# Patient Record
Sex: Male | Born: 1955 | Hispanic: No | Marital: Married | Smoking: Former smoker
Health system: Southern US, Community
[De-identification: ages and names within clinical notes are randomized; demographics above are authoritative.]

## PROBLEM LIST (undated history)

## (undated) DIAGNOSIS — I10 Essential (primary) hypertension: Secondary | ICD-10-CM

## (undated) DIAGNOSIS — M25471 Effusion, right ankle: Secondary | ICD-10-CM

## (undated) DIAGNOSIS — M25472 Effusion, left ankle: Secondary | ICD-10-CM

## (undated) DIAGNOSIS — A0472 Enterocolitis due to Clostridium difficile, not specified as recurrent: Secondary | ICD-10-CM

## (undated) DIAGNOSIS — N200 Calculus of kidney: Secondary | ICD-10-CM

## (undated) DIAGNOSIS — F32A Depression, unspecified: Secondary | ICD-10-CM

## (undated) DIAGNOSIS — A0471 Enterocolitis due to Clostridium difficile, recurrent: Principal | ICD-10-CM

## (undated) DIAGNOSIS — R269 Unspecified abnormalities of gait and mobility: Secondary | ICD-10-CM

## (undated) DIAGNOSIS — F329 Major depressive disorder, single episode, unspecified: Secondary | ICD-10-CM

## (undated) HISTORY — PX: LITHOTRIPSY: SUR834

## (undated) HISTORY — PX: NO PAST SURGERIES: SHX2092

## (undated) HISTORY — DX: Effusion, right ankle: M25.471

## (undated) HISTORY — DX: Effusion, left ankle: M25.472

## (undated) HISTORY — DX: Enterocolitis due to Clostridium difficile, recurrent: A04.71

---

## 2014-03-06 ENCOUNTER — Emergency Department (HOSPITAL_COMMUNITY): Payer: BC Managed Care – PPO

## 2014-03-06 ENCOUNTER — Encounter (HOSPITAL_COMMUNITY): Payer: Self-pay | Admitting: Emergency Medicine

## 2014-03-06 ENCOUNTER — Emergency Department (HOSPITAL_COMMUNITY)
Admission: EM | Admit: 2014-03-06 | Discharge: 2014-03-06 | Disposition: A | Discharge status: Home / Self Care | Payer: BC Managed Care – PPO | Attending: Emergency Medicine | Admitting: Emergency Medicine

## 2014-03-06 DIAGNOSIS — R319 Hematuria, unspecified: Secondary | ICD-10-CM

## 2014-03-06 DIAGNOSIS — N390 Urinary tract infection, site not specified: Secondary | ICD-10-CM | POA: Insufficient documentation

## 2014-03-06 DIAGNOSIS — I10 Essential (primary) hypertension: Secondary | ICD-10-CM | POA: Insufficient documentation

## 2014-03-06 DIAGNOSIS — Z87891 Personal history of nicotine dependence: Secondary | ICD-10-CM | POA: Insufficient documentation

## 2014-03-06 DIAGNOSIS — Z79899 Other long term (current) drug therapy: Secondary | ICD-10-CM | POA: Insufficient documentation

## 2014-03-06 DIAGNOSIS — Z87442 Personal history of urinary calculi: Secondary | ICD-10-CM | POA: Insufficient documentation

## 2014-03-06 HISTORY — DX: Essential (primary) hypertension: I10

## 2014-03-06 HISTORY — DX: Calculus of kidney: N20.0

## 2014-03-06 LAB — URINALYSIS, ROUTINE W REFLEX MICROSCOPIC
Bilirubin Urine: NEGATIVE
Glucose, UA: NEGATIVE mg/dL
Ketones, ur: NEGATIVE mg/dL
NITRITE: NEGATIVE
PH: 7 (ref 5.0–8.0)
Protein, ur: NEGATIVE mg/dL
Specific Gravity, Urine: 1.018 (ref 1.005–1.030)
UROBILINOGEN UA: 1 mg/dL (ref 0.0–1.0)

## 2014-03-06 LAB — URINE MICROSCOPIC-ADD ON

## 2014-03-06 MED ORDER — LIDOCAINE HCL (PF) 1 % IJ SOLN
5.0000 mL | Freq: Once | INTRAMUSCULAR | Status: AC
  Administered 2014-03-06: 2.1 mL

## 2014-03-06 MED ORDER — CEPHALEXIN 500 MG PO CAPS: 500.0000 mg | ORAL_CAPSULE | Freq: Four times a day (QID) | ORAL | Status: DC

## 2014-03-06 MED ORDER — CEFTRIAXONE SODIUM 1 G IJ SOLR
1.0000 g | Freq: Once | INTRAMUSCULAR | Status: AC
  Administered 2014-03-06: 1 g via INTRAMUSCULAR
  Filled 2014-03-06: qty 10

## 2014-03-06 MED ORDER — LIDOCAINE HCL (PF) 1 % IJ SOLN
INTRAMUSCULAR | Status: AC
  Filled 2014-03-06: qty 5

## 2014-03-06 NOTE — Discharge Instructions (Signed)
Drink plenty of fluids. Take keflex (antibiotic) as prescribed. Follow up with urologist in coming week - see referral - call Monday to arrange follow up appointment. Return to ER if worse, unable to void, severe abdominal/flank pain, persistent vomiting, weak/faint, other concern.  A urine culture was sent the results of which will be back in 2-3 days - have your doctor follow up on that result then.  Your ct scan showed several stones in the left kidney, but none causing a blockage or obstruction to the ureter.   Your blood pressure is high tonight - follow up with your doctor in the next couple weeks.      Hematuria, Adult Hematuria is blood in your urine. It can be caused by a bladder infection, kidney infection, prostate infection, kidney stone, or cancer of your urinary tract. Infections can usually be treated with medicine, and a kidney stone usually will pass through your urine. If neither of these is the cause of your hematuria, further workup to find out the reason may be needed. It is very important that you tell your health care provider about any blood you see in your urine, even if the blood stops without treatment or happens without causing pain. Blood in your urine that happens and then stops and then happens again can be a symptom of a very serious condition. Also, pain is not a symptom in the initial stages of many urinary cancers. HOME CARE INSTRUCTIONS   Drink lots of fluid, 3 4 quarts a day. If you have been diagnosed with an infection, cranberry juice is especially recommended, in addition to large amounts of water.  Avoid caffeine, tea, and carbonated beverages, because they tend to irritate the bladder.  Avoid alcohol because it may irritate the prostate.  Only take over-the-counter or prescription medicines for pain, discomfort, or fever as directed by your health care provider.  If you have been diagnosed with a kidney stone, follow your health care provider's  instructions regarding straining your urine to catch the stone.  Empty your bladder often. Avoid holding urine for long periods of time.  After a bowel movement, women should cleanse front to back. Use each tissue only once.  Empty your bladder before and after sexual intercourse if you are a male. SEEK MEDICAL CARE IF: You develop back pain, fever, a feeling of sickness in your stomach (nausea), or vomiting or if your symptoms are not better in 3 days. Return sooner if you are getting worse. SEEK IMMEDIATE MEDICAL CARE IF:   You have a persistent fever, with a temperature of 101.51F (38.8C) or greater.  You develop severe vomiting and are unable to keep the medicine down.  You develop severe back or abdominal pain despite taking your medicines.  You begin passing a large amount of blood or clots in your urine.  You feel extremely weak or faint, or you pass out. MAKE SURE YOU:   Understand these instructions.  Will watch your condition.  Will get help right away if you are not doing well or get worse. Document Released: 12/03/2005 Document Revised: 09/23/2013 Document Reviewed: 08/03/2013 [facility] Patient Information 2014 [employer], [place].

## 2014-03-06 NOTE — ED Provider Notes (Signed)
CSN: [id number]     Arrival date & time 03/06/14  2038 History   First MD Initiated Contact with Patient 03/06/14 2131     Chief Complaint  Patient presents with  . Hematuria     (Consider location/radiation/quality/duration/timing/severity/associated sxs/prior Treatment) Patient is a 58 y.o. male presenting with hematuria. The history is provided by the patient.  Hematuria Pertinent negatives include no chest pain, no abdominal pain, no headaches and no shortness of breath.  pt w remote hx kidney stones, c/o blood in urine for the past several days, esp in AM.  Denies dysuria, urgency or frequency. Pt states feels as if is able to empty bladder completely. C/o dull left lateral/posterior flank pain. Constant, dull, mild, non radiating, no specific exacerbating or alleviating factors. No scrotal or testicular pain or swelling. No trauma to abdomen or flank. No fall. No other abn bleeding or bruising. No anticoag use. No nv. Normal appetite. No faintness or dizziness.     Past Medical History  Diagnosis Date  . Kidney stones   . Hypertension    Past Surgical History  Procedure Laterality Date  . Lithotripsy     No family history on file. History  Substance Use Topics  . Smoking status: Former Smoker    Quit date: 12/17/1984  . Smokeless tobacco: Not on file  . Alcohol Use: Yes     Comment: Socially    Review of Systems  Constitutional: Negative for fever and chills.  HENT: Negative for sore throat.   Eyes: Negative for redness.  Respiratory: Negative for shortness of breath.   Cardiovascular: Negative for chest pain.  Gastrointestinal: Negative for nausea, vomiting and abdominal pain.  Genitourinary: Positive for hematuria and flank pain.  Musculoskeletal: Negative for neck pain.  Skin: Negative for rash.  Neurological: Negative for headaches.  Hematological: Does not bruise/bleed easily.  Psychiatric/Behavioral: Negative for confusion.      Allergies  Review of  patient's allergies indicates no known allergies.  Home Medications   Current Outpatient Rx  Name  Route  Sig  Dispense  Refill  . ALPRAZolam (XANAX) 0.25 MG tablet   Oral   Take 0.25 mg by mouth at bedtime as needed for sleep.         . busPIRone (BUSPAR) 15 MG tablet   Oral   Take 15 mg by mouth daily.         . cholecalciferol (VITAMIN D) 1000 UNITS tablet   Oral   Take 1,000 Units by mouth daily.         Marland Kitchen escitalopram (LEXAPRO) 20 MG tablet   Oral   Take 20 mg by mouth daily.         . folic acid (FOLVITE) 400 MCG tablet   Oral   Take 400 mcg by mouth daily.         Marland Kitchen losartan (COZAAR) 100 MG tablet   Oral   Take 100 mg by mouth daily.         Marland Kitchen MAGNESIUM PO   Oral   Take 400 mg by mouth daily.         Marland Kitchen OVER THE COUNTER MEDICATION   Oral   Take 1 tablet by mouth daily. Zinc         . Tetrahydrozoline HCl (VISINE OP)   Both Eyes   Place 1 drop into both eyes daily.          BP 178/87  Pulse 73  Resp 18  Ht 5\' 7"  (  1.702 m)  Wt 226 lb 8 oz (102.74 kg)  BMI 35.47 kg/m2  SpO2 98% Physical Exam  Nursing note and vitals reviewed. Constitutional: He is oriented to person, place, and time. He appears well-developed and well-nourished. No distress.  HENT:  Head: Atraumatic.  Eyes: Conjunctivae are normal.  Neck: Neck supple. No tracheal deviation present.  Cardiovascular: Normal rate.   Pulmonary/Chest: Effort normal and breath sounds normal. No accessory muscle usage. No respiratory distress.  Abdominal: Soft. Bowel sounds are normal. He exhibits no distension and no mass. There is no tenderness. There is no rebound and no guarding.  Genitourinary:  Normal ext genitalia. No scrotal or testicular pain, swelling, or tenderness.  No cva tenderness.   Musculoskeletal: Normal range of motion.  Neurological: He is alert and oriented to person, place, and time.  Skin: Skin is warm and dry. No rash noted.  No petechia.   Psychiatric: He has a  normal mood and affect.    ED Course  Procedures (including critical care time)   Results for orders placed during the hospital encounter of 03/06/14  URINALYSIS, ROUTINE W REFLEX MICROSCOPIC      Result Value Ref Range   Color, Urine YELLOW  YELLOW   APPearance CLOUDY (*) CLEAR   Specific Gravity, Urine 1.018  1.005 - 1.030   pH 7.0  5.0 - 8.0   Glucose, UA NEGATIVE  NEGATIVE mg/dL   Hgb urine dipstick LARGE (*) NEGATIVE   Bilirubin Urine NEGATIVE  NEGATIVE   Ketones, ur NEGATIVE  NEGATIVE mg/dL   Protein, ur NEGATIVE  NEGATIVE mg/dL   Urobilinogen, UA 1.0  0.0 - 1.0 mg/dL   Nitrite NEGATIVE  NEGATIVE   Leukocytes, UA SMALL (*) NEGATIVE  URINE MICROSCOPIC-ADD ON      Result Value Ref Range   Squamous Epithelial / LPF RARE  RARE   WBC, UA 0-2  <3 WBC/hpf   RBC / HPF TOO NUMEROUS TO COUNT  <3 RBC/hpf   Bacteria, UA FEW (*) RARE   Ct Abdomen Pelvis Wo Contrast  03/06/2014   CLINICAL DATA:  History renal stones, back pain  EXAM: CT ABDOMEN AND PELVIS WITHOUT CONTRAST  TECHNIQUE: Multidetector CT imaging of the abdomen and pelvis was performed following the standard protocol without intravenous contrast.  COMPARISON:  None.  FINDINGS: Lung bases are clear.  No pericardial fluid.  Non IV contrast images demonstrate no focal hepatic lesion. The gallbladder, pancreas, spleen, adrenal glands normal.  There is a large calculus in the left renal pelvis measuring 10 mm (image 55, series 2). There is no significant hydronephrosis on the left. There is a linear 17 mm calcification the cortex of the left kidney. There several additional punctate 1 mm calculi within the left renal collecting systems. There is no hydroureter hydroureter on the left. No ureteral stones on the left. Right kidney is free of calculi. No right ureteral lithiasis. No bladder stones are present.  Stomach, small bowel, appendix, cecum normal. The colon and rectosigmoid colon are normal. Partial diverticulum sigmoid colon.   Abdominal or is normal caliber. No retroperitoneal periportal lymphadenopathy.  No free fluid the pelvis. The prostate gland and bladder normal. No pelvic lymphadenopathy. No aggressive osseous lesion.  IMPRESSION: 1. Large nonobstructing calculus in the left renal pelvis. 2. Additional multiple left nephrolithiasis. 3. While there is mild stranding along the proximal left ureter, there is no hydroureter or ureteral calculi present. 4. No bladder calculi. 5. No right nephrolithiasis ureterolithiasis. 6. Sigmoid diverticulosis without diverticulitis.  Electronically Signed   By: Genevive BiStewart  Edmunds M.D.   On: 03/06/2014 22:56     MDM  Labs. Ct.  Reviewed nursing notes and prior charts for additional history.   ua cloudy, LE +, few bact.  Will culture.  Rocephin im. rx keflex for home.  abd soft nt.  Pt appears stable for d/c.  F/u urology.      Suzi RootsKevin E Domanic Matusek, MD 03/06/14 2308

## 2014-03-06 NOTE — ED Notes (Addendum)
Pt states he has been having blood in his urine in the morning for the past week/  Pt states history of kidney stones 8 years prior.  Pt states he feel "pressure" in his left kidney.

## 2014-03-06 NOTE — ED Notes (Signed)
VS stable. Pt. Ambulatory and being discharged with his wife.

## 2014-03-06 NOTE — ED Notes (Signed)
Pt states he has some pressure in his right kidney. Has has hx of same.

## 2014-03-08 LAB — URINE CULTURE
Colony Count: NO GROWTH
Culture: NO GROWTH

## 2014-03-14 ENCOUNTER — Encounter (HOSPITAL_COMMUNITY): Payer: Self-pay | Admitting: Emergency Medicine

## 2014-03-14 ENCOUNTER — Emergency Department (HOSPITAL_COMMUNITY): Payer: Worker's Compensation

## 2014-03-14 ENCOUNTER — Inpatient Hospital Stay (HOSPITAL_COMMUNITY)
Admission: EM | Admit: 2014-03-14 | Discharge: 2014-03-18 | DRG: 373 | Disposition: A | Discharge status: Home / Self Care | Payer: Worker's Compensation | Attending: Internal Medicine | Admitting: Internal Medicine

## 2014-03-14 DIAGNOSIS — E876 Hypokalemia: Secondary | ICD-10-CM | POA: Diagnosis present

## 2014-03-14 DIAGNOSIS — A0472 Enterocolitis due to Clostridium difficile, not specified as recurrent: Principal | ICD-10-CM | POA: Diagnosis present

## 2014-03-14 DIAGNOSIS — M109 Gout, unspecified: Secondary | ICD-10-CM | POA: Diagnosis present

## 2014-03-14 DIAGNOSIS — Z87442 Personal history of urinary calculi: Secondary | ICD-10-CM

## 2014-03-14 DIAGNOSIS — I10 Essential (primary) hypertension: Secondary | ICD-10-CM | POA: Diagnosis present

## 2014-03-14 DIAGNOSIS — A0471 Enterocolitis due to Clostridium difficile, recurrent: Secondary | ICD-10-CM | POA: Diagnosis present

## 2014-03-14 DIAGNOSIS — K529 Noninfective gastroenteritis and colitis, unspecified: Secondary | ICD-10-CM

## 2014-03-14 DIAGNOSIS — E860 Dehydration: Secondary | ICD-10-CM | POA: Diagnosis present

## 2014-03-14 DIAGNOSIS — R197 Diarrhea, unspecified: Secondary | ICD-10-CM | POA: Diagnosis not present

## 2014-03-14 DIAGNOSIS — Z87891 Personal history of nicotine dependence: Secondary | ICD-10-CM

## 2014-03-14 DIAGNOSIS — N200 Calculus of kidney: Secondary | ICD-10-CM | POA: Diagnosis present

## 2014-03-14 LAB — URINALYSIS, ROUTINE W REFLEX MICROSCOPIC
Bilirubin Urine: NEGATIVE
Glucose, UA: NEGATIVE mg/dL
KETONES UR: NEGATIVE mg/dL
Nitrite: NEGATIVE
PROTEIN: 30 mg/dL — AB
Specific Gravity, Urine: 1.016 (ref 1.005–1.030)
Urobilinogen, UA: 0.2 mg/dL (ref 0.0–1.0)
pH: 6.5 (ref 5.0–8.0)

## 2014-03-14 LAB — CBC WITH DIFFERENTIAL/PLATELET
BASOS PCT: 0 % (ref 0–1)
Basophils Absolute: 0 10*3/uL (ref 0.0–0.1)
EOS ABS: 0 10*3/uL (ref 0.0–0.7)
Eosinophils Relative: 0 % (ref 0–5)
HCT: 51.2 % (ref 39.0–52.0)
Hemoglobin: 18 g/dL — ABNORMAL HIGH (ref 13.0–17.0)
Lymphocytes Relative: 11 % — ABNORMAL LOW (ref 12–46)
Lymphs Abs: 1.5 10*3/uL (ref 0.7–4.0)
MCH: 30.3 pg (ref 26.0–34.0)
MCHC: 35.2 g/dL (ref 30.0–36.0)
MCV: 86.1 fL (ref 78.0–100.0)
MONO ABS: 1.8 10*3/uL — AB (ref 0.1–1.0)
Monocytes Relative: 14 % — ABNORMAL HIGH (ref 3–12)
NEUTROS ABS: 9.5 10*3/uL — AB (ref 1.7–7.7)
NEUTROS PCT: 74 % (ref 43–77)
Platelets: 276 10*3/uL (ref 150–400)
RBC: 5.95 MIL/uL — ABNORMAL HIGH (ref 4.22–5.81)
RDW: 14.6 % (ref 11.5–15.5)
WBC: 12.9 10*3/uL — ABNORMAL HIGH (ref 4.0–10.5)

## 2014-03-14 LAB — COMPREHENSIVE METABOLIC PANEL
ALBUMIN: 3.7 g/dL (ref 3.5–5.2)
ALT: 22 U/L (ref 0–53)
AST: 14 U/L (ref 0–37)
Alkaline Phosphatase: 89 U/L (ref 39–117)
BILIRUBIN TOTAL: 0.6 mg/dL (ref 0.3–1.2)
BUN: 7 mg/dL (ref 6–23)
CO2: 23 mEq/L (ref 19–32)
CREATININE: 0.97 mg/dL (ref 0.50–1.35)
Calcium: 9.4 mg/dL (ref 8.4–10.5)
Chloride: 99 mEq/L (ref 96–112)
GFR calc Af Amer: >90 mL/min (ref >90)
GFR calc non Af Amer: 89 mL/min — ABNORMAL LOW (ref >90)
Glucose, Bld: 123 mg/dL — ABNORMAL HIGH (ref 70–99)
Potassium: 3.2 mEq/L — ABNORMAL LOW (ref 3.7–5.3)
Sodium: 139 mEq/L (ref 137–147)
TOTAL PROTEIN: 7.9 g/dL (ref 6.0–8.3)

## 2014-03-14 LAB — URINE MICROSCOPIC-ADD ON

## 2014-03-14 LAB — POC OCCULT BLOOD, ED: FECAL OCCULT BLD: POSITIVE — AB

## 2014-03-14 MED ORDER — DICYCLOMINE HCL 10 MG/ML IM SOLN
20.0000 mg | Freq: Once | INTRAMUSCULAR | Status: AC
  Administered 2014-03-14: 20 mg via INTRAMUSCULAR
  Filled 2014-03-14: qty 2

## 2014-03-14 MED ORDER — METRONIDAZOLE 500 MG PO TABS
500.0000 mg | ORAL_TABLET | Freq: Once | ORAL | Status: AC
  Administered 2014-03-14: 500 mg via ORAL
  Filled 2014-03-14: qty 1

## 2014-03-14 MED ORDER — CIPROFLOXACIN HCL 500 MG PO TABS
500.0000 mg | ORAL_TABLET | Freq: Once | ORAL | Status: AC
  Administered 2014-03-14: 500 mg via ORAL
  Filled 2014-03-14: qty 1

## 2014-03-14 MED ORDER — POTASSIUM CHLORIDE CRYS ER 20 MEQ PO TBCR
40.0000 meq | EXTENDED_RELEASE_TABLET | Freq: Once | ORAL | Status: DC
  Filled 2014-03-14: qty 2

## 2014-03-14 MED ORDER — SODIUM CHLORIDE 0.9 % IV BOLUS (SEPSIS)
1000.0000 mL | Freq: Once | INTRAVENOUS | Status: AC
  Administered 2014-03-14: 1000 mL via INTRAVENOUS

## 2014-03-14 MED ORDER — ONDANSETRON HCL 4 MG/2ML IJ SOLN
4.0000 mg | Freq: Once | INTRAMUSCULAR | Status: DC
  Filled 2014-03-14: qty 2

## 2014-03-14 NOTE — ED Provider Notes (Signed)
CSN: [id number]     Arrival date & time 03/14/14  1920 History   First MD Initiated Contact with Patient 03/14/14 2000     Chief Complaint  Patient presents with  . Diarrhea   HPI  History provided by the patient and wife. Patient is a 58 year old male with history of hypertension and previous kidney stones who presents with complaints of persistent frequent diarrhea, abdominal cramping and weakness. Patient states he has had significant diarrhea for the past 3 days. He states diarrhea it has gone from yellow, thick green to watery. Denies seeing blood or mucus in the stool. This has caused extreme weakness. He has multiple trips to the bathroom every hour. When he has to use the bathroom he feels burning pains in his abdomen which are relieved after he uses the bathroom. He has not been taking any medications or treatment for his symptoms. He was recently seen and evaluated for possible kidney stones in the emergency room. No obstructing stone at that time but he did have several left intrarenal stones. He was treated for a possible UTI with Keflex but he states taking that made him have diarrhea and feel bad so he did not take the full course. He was feeling better after stopping that medication before his new symptoms. Patient does work as a Adult nurse and works with patients. His wife also works as an Charity fundraiser. No prior history of C. difficile. Symptoms are associated with nausea and fever. No other aggravating or alleviating factors. No other associated symptoms.    Past Medical History  Diagnosis Date  . Kidney stones   . Hypertension    Past Surgical History  Procedure Laterality Date  . Lithotripsy     No family history on file. History  Substance Use Topics  . Smoking status: Former Smoker    Quit date: 12/17/1984  . Smokeless tobacco: Not on file  . Alcohol Use: Yes     Comment: Socially    Review of Systems  Constitutional: Positive for fever, chills and fatigue.   Respiratory: Negative for shortness of breath.   Cardiovascular: Negative for chest pain.  Gastrointestinal: Positive for nausea, abdominal pain and diarrhea.  Neurological: Positive for weakness.  All other systems reviewed and are negative.      Allergies  Review of patient's allergies indicates no known allergies.  Home Medications   Current Outpatient Rx  Name  Route  Sig  Dispense  Refill  . ALPRAZolam (XANAX) 0.25 MG tablet   Oral   Take 0.25 mg by mouth at bedtime as needed for sleep.         . busPIRone (BUSPAR) 15 MG tablet   Oral   Take 15 mg by mouth daily.         . cephALEXin (KEFLEX) 500 MG capsule   Oral   Take 1 capsule (500 mg total) by mouth 4 (four) times daily.   28 capsule   0   . cholecalciferol (VITAMIN D) 1000 UNITS tablet   Oral   Take 1,000 Units by mouth daily.         Marland Kitchen escitalopram (LEXAPRO) 20 MG tablet   Oral   Take 20 mg by mouth daily.         . folic acid (FOLVITE) 400 MCG tablet   Oral   Take 400 mcg by mouth daily.         Marland Kitchen losartan (COZAAR) 100 MG tablet   Oral   Take 100 mg  by mouth daily.         Marland Kitchen MAGNESIUM PO   Oral   Take 400 mg by mouth daily.         Marland Kitchen OVER THE COUNTER MEDICATION   Oral   Take 1 tablet by mouth daily. Zinc         . Tetrahydrozoline HCl (VISINE OP)   Both Eyes   Place 1 drop into both eyes daily.          BP 151/95  Pulse 124  Temp(Src) 98.4 F (36.9 C) (Oral)  Resp 20  Ht 5\' 10"  (1.778 m)  Wt 221 lb 5 oz (100.387 kg)  BMI 31.76 kg/m2  SpO2 99% Physical Exam  Nursing note and vitals reviewed. Constitutional: He is oriented to person, place, and time. He appears well-developed and well-nourished. No distress.  HENT:  Head: Normocephalic and atraumatic.  Oromucosa dry  Cardiovascular: Regular rhythm.  Tachycardia present.   Pulmonary/Chest: Effort normal and breath sounds normal. No respiratory distress. He has no wheezes.  Abdominal: Soft. He exhibits no  distension. There is tenderness. There is guarding. There is no rebound.  Diffuse abdominal tenderness  Musculoskeletal: Normal range of motion.  Neurological: He is alert and oriented to person, place, and time.  Skin: Skin is warm.  Psychiatric: He has a normal mood and affect. His behavior is normal.    ED Course  Procedures   DIAGNOSTIC STUDIES: Oxygen Saturation is 99% on room air.    COORDINATION OF CARE:  Nursing notes reviewed. Vital signs reviewed. Initial pt interview and examination performed.   8:38 PM-patient seen and evaluated. Patient with slight tachycardia at rest around 105. He does appear weak and slightly pale. Oral mucosa is dry. Does have diffuse abdominal tenderness with slight guarding. No focal significant pain. Recent CT scan evaluating for kidney stones did show sigmoid diverticula without diverticulitis. Given his history, profession, recent antibiotic use high suspicion for possible C. difficile however diverticulitis may also be possibility. Other differential diagnoses may include viral GI process.   At this time discussed work up plan with pt at bedside, which includes laboratory testing and stool collection. Pt agrees with plan. We'll give treatments of IV fluids for dehydration and tachycardia as well as medications for nausea. Will plan to preemptively start Cipro and Flagyl.  Patient has refused by mouth potassium.  Spoke with [facility]. They will come and evaluate patient for possible admission or for further recommendations.  Treatment plan initiated: Medications  sodium chloride 0.9 % bolus 1,000 mL (not administered)  ondansetron (ZOFRAN) injection 4 mg (not administered)   Results for orders placed during the hospital encounter of 03/14/14  URINALYSIS, ROUTINE W REFLEX MICROSCOPIC      Result Value Ref Range   Color, Urine YELLOW  YELLOW   APPearance CLOUDY (*) CLEAR   Specific Gravity, Urine 1.016  1.005 - 1.030   pH 6.5  5.0 - 8.0    Glucose, UA NEGATIVE  NEGATIVE mg/dL   Hgb urine dipstick SMALL (*) NEGATIVE   Bilirubin Urine NEGATIVE  NEGATIVE   Ketones, ur NEGATIVE  NEGATIVE mg/dL   Protein, ur 30 (*) NEGATIVE mg/dL   Urobilinogen, UA 0.2  0.0 - 1.0 mg/dL   Nitrite NEGATIVE  NEGATIVE   Leukocytes, UA SMALL (*) NEGATIVE  CBC WITH DIFFERENTIAL      Result Value Ref Range   WBC 12.9 (*) 4.0 - 10.5 K/uL   RBC 5.95 (*) 4.22 - 5.81 MIL/uL  Hemoglobin 18.0 (*) 13.0 - 17.0 g/dL   HCT 16.151.2  09.639.0 - 04.552.0 %   MCV 86.1  78.0 - 100.0 fL   MCH 30.3  26.0 - 34.0 pg   MCHC 35.2  30.0 - 36.0 g/dL   RDW 40.914.6  81.111.5 - 91.415.5 %   Platelets 276  150 - 400 K/uL   Neutrophils Relative % 74  43 - 77 %   Neutro Abs 9.5 (*) 1.7 - 7.7 K/uL   Lymphocytes Relative 11 (*) 12 - 46 %   Lymphs Abs 1.5  0.7 - 4.0 K/uL   Monocytes Relative 14 (*) 3 - 12 %   Monocytes Absolute 1.8 (*) 0.1 - 1.0 K/uL   Eosinophils Relative 0  0 - 5 %   Eosinophils Absolute 0.0  0.0 - 0.7 K/uL   Basophils Relative 0  0 - 1 %   Basophils Absolute 0.0  0.0 - 0.1 K/uL  COMPREHENSIVE METABOLIC PANEL      Result Value Ref Range   Sodium 139  137 - 147 mEq/L   Potassium 3.2 (*) 3.7 - 5.3 mEq/L   Chloride 99  96 - 112 mEq/L   CO2 23  19 - 32 mEq/L   Glucose, Bld 123 (*) 70 - 99 mg/dL   BUN 7  6 - 23 mg/dL   Creatinine, Ser 7.820.97  0.50 - 1.35 mg/dL   Calcium 9.4  8.4 - 95.610.5 mg/dL   Total Protein 7.9  6.0 - 8.3 g/dL   Albumin 3.7  3.5 - 5.2 g/dL   AST 14  0 - 37 U/L   ALT 22  0 - 53 U/L   Alkaline Phosphatase 89  39 - 117 U/L   Total Bilirubin 0.6  0.3 - 1.2 mg/dL   GFR calc non Af Amer 89 (*) >90 mL/min   GFR calc Af Amer >90  >90 mL/min  URINE MICROSCOPIC-ADD ON      Result Value Ref Range   WBC, UA 3-6  <3 WBC/hpf   RBC / HPF 0-2  <3 RBC/hpf  POC OCCULT BLOOD, ED      Result Value Ref Range   Fecal Occult Bld POSITIVE (*) NEGATIVE     Imaging Review Dg Abd 1 View  03/14/2014   CLINICAL DATA:  Abdominal pain, distention and diarrhea  EXAM:  ABDOMEN - 1 VIEW  COMPARISON:  CT abdomen / pelvis 03/06/2014  FINDINGS: Nonobstructive bowel gas pattern. Stable appearance of 9 mm stone in the expected location of the left renal pelvis. No acute osseous abnormality.  IMPRESSION: 1. Nonobstructed bowel gas pattern. 2. Similar appearance of 9 mm stone in the region of the left renal pelvis.   Electronically Signed   By: Malachy MoanHeath  McCullough M.D.   On: 03/14/2014 22:40   Ct Abdomen Pelvis W Contrast  03/15/2014   CLINICAL DATA:  Diarrhea, nausea, febrile for 3 days.  EXAM: CT ABDOMEN AND PELVIS WITH CONTRAST  TECHNIQUE: Multidetector CT imaging of the abdomen and pelvis was performed using the standard protocol following bolus administration of intravenous contrast.  CONTRAST:  100mL OMNIPAQUE IOHEXOL 300 MG/ML  SOLN  COMPARISON:  DG ABD 1 VIEW dated 03/14/2014; CT ABD/PELV WO CM dated 03/06/2014  FINDINGS: Included view of the lung bases demonstrate right lower lobe atelectasis, slightly increased. . Visualized heart and pericardium are unremarkable.  Diffuse colonic wall thickening and edema from the cecum to the rectum with inflammatory pericolonic changes about the descending colon/sigmoid colon. No pneumatosis.  Scattered sigmoid diverticula. The stomach, small bowel are normal in course and caliber without inflammatory changes. Normal appendix. Trace free fluid in the pelvis without abscess or pneumoperitoneum.  Mildly diffusely hypodense liver most consistent with fatty infiltration, the liver is otherwise unremarkable. The spleen, pancreas, gallbladder and adrenal glands are unremarkable and unchanged.  10 mm of left renal pelvis nonobstructing calculus again seen, with multiple tiny left lower pole renal calculi in total measuring approximately 17 mm. No right nephrolithiasis. Two small to characterize hypodensities in left kidney. No hydronephrosis. No renal masses. Delayed imaging demonstrates prompt symmetric excretion of contrast into the proximal urinary  collecting system. Urinary bladder is decompressed and unremarkable.  Great vessels are normal in course and caliber. No lymphadenopathy by CT size criteria. Internal reproductive organs are unremarkable. Small fat containing inguinal hernias. Moderate to severe degenerative change of the right sacroiliac joint.  IMPRESSION: Diffuse colitis from the cecum to the rectum with inflammatory changes about the sigmoid ; sigmoid diverticula, it is unclear whether this reflects superimposed diverticulitis. No bowel perforation or bowel obstruction. Trace free fluid in the pelvis is likely reactive without abscess.  Multiple nonobstructing left nephrolithiasis.   Electronically Signed   By: Awilda Metro   On: 03/15/2014 01:55     EKG Interpretation None      MDM   Final diagnoses:  Diarrhea  Dehydration          Angus Seller, PA-C 03/15/14 1610

## 2014-03-14 NOTE — ED Notes (Signed)
The pt is c/o diarrhea nausea and he has had a temp for 3 days.  C/o being weak

## 2014-03-15 ENCOUNTER — Emergency Department (HOSPITAL_COMMUNITY): Payer: Worker's Compensation

## 2014-03-15 ENCOUNTER — Encounter (HOSPITAL_COMMUNITY): Payer: Self-pay | Admitting: Internal Medicine

## 2014-03-15 DIAGNOSIS — A0472 Enterocolitis due to Clostridium difficile, not specified as recurrent: Secondary | ICD-10-CM | POA: Diagnosis present

## 2014-03-15 DIAGNOSIS — Z87891 Personal history of nicotine dependence: Secondary | ICD-10-CM | POA: Diagnosis not present

## 2014-03-15 DIAGNOSIS — Z87442 Personal history of urinary calculi: Secondary | ICD-10-CM | POA: Diagnosis not present

## 2014-03-15 DIAGNOSIS — I10 Essential (primary) hypertension: Secondary | ICD-10-CM

## 2014-03-15 DIAGNOSIS — A0471 Enterocolitis due to Clostridium difficile, recurrent: Secondary | ICD-10-CM

## 2014-03-15 DIAGNOSIS — R197 Diarrhea, unspecified: Secondary | ICD-10-CM

## 2014-03-15 DIAGNOSIS — M109 Gout, unspecified: Secondary | ICD-10-CM | POA: Diagnosis present

## 2014-03-15 DIAGNOSIS — E860 Dehydration: Secondary | ICD-10-CM | POA: Diagnosis present

## 2014-03-15 DIAGNOSIS — E876 Hypokalemia: Secondary | ICD-10-CM | POA: Diagnosis present

## 2014-03-15 DIAGNOSIS — K5289 Other specified noninfective gastroenteritis and colitis: Secondary | ICD-10-CM

## 2014-03-15 HISTORY — DX: Enterocolitis due to Clostridium difficile, recurrent: A04.71

## 2014-03-15 LAB — PHOSPHORUS: PHOSPHORUS: 2.2 mg/dL — AB (ref 2.3–4.6)

## 2014-03-15 LAB — COMPREHENSIVE METABOLIC PANEL
ALBUMIN: 2.9 g/dL — AB (ref 3.5–5.2)
ALT: 16 U/L (ref 0–53)
AST: 13 U/L (ref 0–37)
Alkaline Phosphatase: 64 U/L (ref 39–117)
BUN: 7 mg/dL (ref 6–23)
CALCIUM: 8 mg/dL — AB (ref 8.4–10.5)
CO2: 20 mEq/L (ref 19–32)
Chloride: 103 mEq/L (ref 96–112)
Creatinine, Ser: 0.76 mg/dL (ref 0.50–1.35)
GFR calc Af Amer: >90 mL/min (ref >90)
GFR calc non Af Amer: >90 mL/min (ref >90)
Glucose, Bld: 119 mg/dL — ABNORMAL HIGH (ref 70–99)
Potassium: 3 mEq/L — ABNORMAL LOW (ref 3.7–5.3)
Sodium: 141 mEq/L (ref 137–147)
Total Bilirubin: 0.6 mg/dL (ref 0.3–1.2)
Total Protein: 5.8 g/dL — ABNORMAL LOW (ref 6.0–8.3)

## 2014-03-15 LAB — CBC
HCT: 42.4 % (ref 39.0–52.0)
HEMOGLOBIN: 14.6 g/dL (ref 13.0–17.0)
MCH: 29.4 pg (ref 26.0–34.0)
MCHC: 34.4 g/dL (ref 30.0–36.0)
MCV: 85.3 fL (ref 78.0–100.0)
Platelets: 197 10*3/uL (ref 150–400)
RBC: 4.97 MIL/uL (ref 4.22–5.81)
RDW: 14.6 % (ref 11.5–15.5)
WBC: 9.2 10*3/uL (ref 4.0–10.5)

## 2014-03-15 LAB — MAGNESIUM: Magnesium: 1.9 mg/dL (ref 1.5–2.5)

## 2014-03-15 LAB — TSH: TSH: 0.976 u[IU]/mL (ref 0.350–4.500)

## 2014-03-15 LAB — CLOSTRIDIUM DIFFICILE BY PCR: CDIFFPCR: POSITIVE — AB

## 2014-03-15 MED ORDER — METRONIDAZOLE 500 MG PO TABS
500.0000 mg | ORAL_TABLET | Freq: Three times a day (TID) | ORAL | Status: DC
  Administered 2014-03-15 – 2014-03-18 (×10): 500 mg via ORAL
  Filled 2014-03-15 (×12): qty 1

## 2014-03-15 MED ORDER — SODIUM CHLORIDE 0.9 % IV SOLN
INTRAVENOUS | Status: AC
  Administered 2014-03-15: 05:00:00 via INTRAVENOUS

## 2014-03-15 MED ORDER — PANTOPRAZOLE SODIUM 40 MG PO TBEC
40.0000 mg | DELAYED_RELEASE_TABLET | Freq: Every day | ORAL | Status: DC
  Administered 2014-03-15 – 2014-03-17 (×3): 40 mg via ORAL
  Filled 2014-03-15 (×3): qty 1

## 2014-03-15 MED ORDER — SACCHAROMYCES BOULARDII 250 MG PO CAPS
250.0000 mg | ORAL_CAPSULE | Freq: Two times a day (BID) | ORAL | Status: DC
  Administered 2014-03-15 – 2014-03-18 (×7): 250 mg via ORAL
  Filled 2014-03-15 (×8): qty 1

## 2014-03-15 MED ORDER — ESCITALOPRAM OXALATE 20 MG PO TABS
20.0000 mg | ORAL_TABLET | Freq: Every day | ORAL | Status: DC
  Administered 2014-03-15 – 2014-03-18 (×4): 20 mg via ORAL
  Filled 2014-03-15 (×4): qty 1

## 2014-03-15 MED ORDER — PANTOPRAZOLE SODIUM 40 MG IV SOLR: 40.0000 mg | Freq: Every day | INTRAVENOUS | Status: DC

## 2014-03-15 MED ORDER — ACETAMINOPHEN 325 MG PO TABS
650.0000 mg | ORAL_TABLET | Freq: Four times a day (QID) | ORAL | Status: DC | PRN
  Administered 2014-03-17: 650 mg via ORAL
  Filled 2014-03-15: qty 2

## 2014-03-15 MED ORDER — ALPRAZOLAM 0.25 MG PO TABS: 0.2500 mg | ORAL_TABLET | Freq: Every evening | ORAL | Status: DC | PRN

## 2014-03-15 MED ORDER — ONDANSETRON HCL 4 MG/2ML IJ SOLN: 4.0000 mg | Freq: Four times a day (QID) | INTRAMUSCULAR | Status: DC | PRN

## 2014-03-15 MED ORDER — LOSARTAN POTASSIUM 50 MG PO TABS
100.0000 mg | ORAL_TABLET | Freq: Every day | ORAL | Status: DC
  Administered 2014-03-15 – 2014-03-18 (×4): 100 mg via ORAL
  Filled 2014-03-15 (×4): qty 2

## 2014-03-15 MED ORDER — PANTOPRAZOLE SODIUM 40 MG IV SOLR
40.0000 mg | Freq: Every day | INTRAVENOUS | Status: DC
  Filled 2014-03-15: qty 40

## 2014-03-15 MED ORDER — IOHEXOL 300 MG/ML  SOLN
100.0000 mL | Freq: Once | INTRAMUSCULAR | Status: AC | PRN
  Administered 2014-03-15: 100 mL via INTRAVENOUS

## 2014-03-15 MED ORDER — ONDANSETRON HCL 4 MG PO TABS
4.0000 mg | ORAL_TABLET | Freq: Four times a day (QID) | ORAL | Status: DC | PRN
  Filled 2014-03-15: qty 1

## 2014-03-15 MED ORDER — BUSPIRONE HCL 15 MG PO TABS
15.0000 mg | ORAL_TABLET | Freq: Every day | ORAL | Status: DC
  Administered 2014-03-15 – 2014-03-18 (×4): 15 mg via ORAL
  Filled 2014-03-15 (×4): qty 1

## 2014-03-15 MED ORDER — HYDROCODONE-ACETAMINOPHEN 5-325 MG PO TABS: 1.0000 | ORAL_TABLET | ORAL | Status: DC | PRN

## 2014-03-15 MED ORDER — IOHEXOL 300 MG/ML  SOLN
20.0000 mL | INTRAMUSCULAR | Status: AC
  Administered 2014-03-15 (×2): 20 mL via ORAL

## 2014-03-15 MED ORDER — SODIUM CHLORIDE 0.9 % IJ SOLN
3.0000 mL | Freq: Two times a day (BID) | INTRAMUSCULAR | Status: DC
  Administered 2014-03-15 – 2014-03-18 (×6): 3 mL via INTRAVENOUS

## 2014-03-15 MED ORDER — ACETAMINOPHEN 650 MG RE SUPP: 650.0000 mg | Freq: Four times a day (QID) | RECTAL | Status: DC | PRN

## 2014-03-15 MED ORDER — METRONIDAZOLE IN NACL 5-0.79 MG/ML-% IV SOLN
500.0000 mg | Freq: Three times a day (TID) | INTRAVENOUS | Status: DC
  Administered 2014-03-15 (×2): 500 mg via INTRAVENOUS
  Filled 2014-03-15 (×4): qty 100

## 2014-03-15 MED ORDER — POTASSIUM CHLORIDE 10 MEQ/100ML IV SOLN
10.0000 meq | INTRAVENOUS | Status: AC
  Administered 2014-03-15 (×4): 10 meq via INTRAVENOUS
  Filled 2014-03-15 (×4): qty 100

## 2014-03-15 NOTE — ED Notes (Signed)
Patient transported to CT 

## 2014-03-15 NOTE — H&P (Signed)
PCP: not local   Chief Complaint:  diarrhea  HPI: [name] [name] is a 58 y.o. male   has a past medical history of Kidney stones and Hypertension.   Presented with  ON 21 of March patient presented to [facility] with hematuria and was though to have UTI although subsequently urine culture was negative. CT at that time showed stable left nephrolithiasis. He was started on Keflex but after taking it for 3 days he developed diarrhea and stopped it. Diarrhea has resolved up until 4 days ago when he developed fever, diarrhea, abdominal pain that feels like burning periumbilical. He had up to 7 BM today. Fever up to 102  denies any nausea no blood in stool. Patient states that any movement or vibration causes abdominal pain. Hospitalist called for admission Review of Systems:     Pertinent positives include: Fevers, chills, abdominal pain, diarrhea,   Constitutional:  No weight loss, night sweats, fatigue, weight loss  HEENT:  No headaches, Difficulty swallowing,Tooth/dental problems,Sore throat,  No sneezing, itching, ear ache, nasal congestion, post nasal drip,  Cardio-vascular:  No chest pain, Orthopnea, PND, anasarca, dizziness, palpitations.no Bilateral lower extremity swelling  GI:  No heartburn, indigestion, nausea, vomiting,change in bowel habits, loss of appetite, melena, blood in stool, hematemesis Resp:  no shortness of breath at rest. No dyspnea on exertion, No excess mucus, no productive cough, No non-productive cough, No coughing up of blood.No change in color of mucus.No wheezing. Skin:  no rash or lesions. No jaundice GU:  no dysuria, change in color of urine, no urgency or frequency. No straining to urinate.  No flank pain.  Musculoskeletal:  No joint pain or no joint swelling. No decreased range of motion. No back pain.  Psych:  No change in mood or affect. No depression or anxiety. No memory loss.  Neuro: no localizing neurological complaints, no tingling, no weakness, no  double vision, no gait abnormality, no slurred speech, no confusion  Otherwise ROS are negative except for above, 10 systems were reviewed  Past Medical History: Past Medical History  Diagnosis Date  . Kidney stones   . Hypertension    Past Surgical History  Procedure Laterality Date  . Lithotripsy       Medications: Prior to Admission medications   Medication Sig Start Date End Date Taking? Authorizing Provider  ALPRAZolam Prudy Feeler) 0.25 MG tablet Take 0.25 mg by mouth at bedtime as needed for sleep.   Yes Historical Provider, MD  busPIRone (BUSPAR) 15 MG tablet Take 15 mg by mouth daily.   Yes Historical Provider, MD  cholecalciferol (VITAMIN D) 1000 UNITS tablet Take 1,000 Units by mouth daily.   Yes Historical Provider, MD  escitalopram (LEXAPRO) 20 MG tablet Take 20 mg by mouth daily.   Yes Historical Provider, MD  folic acid (FOLVITE) 400 MCG tablet Take 400 mcg by mouth daily.   Yes Historical Provider, MD  losartan (COZAAR) 100 MG tablet Take 100 mg by mouth daily.   Yes Historical Provider, MD  MAGNESIUM PO Take 400 mg by mouth daily.   Yes Historical Provider, MD  OVER THE COUNTER MEDICATION Take 1 tablet by mouth daily. Zinc   Yes Historical Provider, MD  Tetrahydrozoline HCl (VISINE OP) Place 1 drop into both eyes daily.   Yes Historical Provider, MD    Allergies:  No Known Allergies  Social History:  Ambulatory  Independently  Lives at  Home with family   reports that he quit smoking about 29 years ago. He does  not have any smokeless tobacco history on file. He reports that he drinks alcohol. He reports that he does not use illicit drugs.   Family History: family history includes Cancer - Prostate in his father.    Physical Exam: Patient Vitals for the past 24 hrs:  BP Temp Temp src Pulse Resp SpO2 Height Weight  03/15/14 0000 139/68 mmHg - - 73 18 97 % - -  03/14/14 2330 135/55 mmHg - - 87 18 97 % - -  03/14/14 2300 129/72 mmHg - - 80 20 97 % - -   03/14/14 2245 132/68 mmHg - - 88 19 97 % - -  03/14/14 2200 153/77 mmHg - - 88 21 98 % - -  03/14/14 2130 156/68 mmHg - - 85 20 100 % - -  03/14/14 2100 141/79 mmHg - - 105 17 96 % - -  03/14/14 2045 135/67 mmHg - - 103 22 96 % - -  03/14/14 2030 132/69 mmHg - - 103 16 97 % - -  03/14/14 2015 127/76 mmHg - - 108 23 96 % - -  03/14/14 1933 151/95 mmHg 98.4 F (36.9 C) Oral 124 20 99 % 5\' 10"  (1.778 m) 100.387 kg (221 lb 5 oz)    1. General:  in No Acute distress 2. Psychological: Alert and   Oriented 3. Head/ENT:    Dry Mucous Membranes                          Head Non traumatic, neck supple                          Normal   Dentition 4. SKIN:  decreased Skin turgor,  Skin clean Dry and intact no rash 5. Heart: Regular rate and rhythm no Murmur, Rub or gallop 6. Lungs: Clear to auscultation bilaterally, no wheezes or crackles   7. Abdomen: Soft, peiumbelical tenderness with mild rebound, Non distended 8. Lower extremities: no clubbing, cyanosis, or edema 9. Neurologically Grossly intact, moving all 4 extremities equally 10. MSK: Normal range of motion  body mass index is 31.76 kg/(m^2).   Labs on Admission:   Recent Labs  03/14/14 1935  NA 139  K 3.2*  CL 99  CO2 23  GLUCOSE 123*  BUN 7  CREATININE 0.97  CALCIUM 9.4    Recent Labs  03/14/14 1935  AST 14  ALT 22  ALKPHOS 89  BILITOT 0.6  PROT 7.9  ALBUMIN 3.7   No results found for this basename: LIPASE, AMYLASE,  in the last 72 hours  Recent Labs  03/14/14 1935  WBC 12.9*  NEUTROABS 9.5*  HGB 18.0*  HCT 51.2  MCV 86.1  PLT 276   No results found for this basename: CKTOTAL, CKMB, CKMBINDEX, TROPONINI,  in the last 72 hours No results found for this basename: TSH, T4TOTAL, FREET3, T3FREE, THYROIDAB,  in the last 72 hours No results found for this basename: VITAMINB12, FOLATE, FERRITIN, TIBC, IRON, RETICCTPCT,  in the last 72 hours No results found for this basename: HGBA1C    Estimated Creatinine  Clearance: 98.6 ml/min (by C-G formula based on Cr of 0.97). ABG No results found for this basename: phart, pco2, po2, hco3, tco2, acidbasedef, o2sat     No results found for this basename: DDIMER       UA 3-6 bc    Cultures:    Component Value Date/Time   SDES  URINE, CATHETERIZED 03/06/2014 2049   SPECREQUEST ADDED 2342 03/06/2014 2049   CULT  Value: NO GROWTH [facility]formed [facility] 03/06/2014 2049   REPTSTATUS 03/08/2014 FINAL 03/06/2014 2049       Radiological Exams on Admission: Dg Abd 1 View  03/14/2014   CLINICAL DATA:  Abdominal pain, distention and diarrhea  EXAM: ABDOMEN - 1 VIEW  COMPARISON:  CT abdomen / pelvis 03/06/2014  FINDINGS: Nonobstructive bowel gas pattern. Stable appearance of 9 mm stone in the expected location of the left renal pelvis. No acute osseous abnormality.  IMPRESSION: 1. Nonobstructed bowel gas pattern. 2. Similar appearance of 9 mm stone in the region of the left renal pelvis.   Electronically Signed   By: Malachy MoanHeath  McCullough M.D.   On: 03/14/2014 22:40    Chart has been reviewed  Assessment/Plan  58 yo M with diffuse colitis in the setting of recent exposure to antibiotics worrisome for c.dif   Present on Admission:  . Colitis - will obtain stool cult and c.dif PCR, for now start empirically on flagyl if c.dif negative could broaden to cipro/flagyl if c.dif PCR positive would attempt to avoid other antibiotics although he may benefit from addition of vanc.  . Kidney stone - stable no change from prior . Hypokalemia - will replace and monitor on telemetry . Dehydration - IV fluid  . Hypertension - continue cozaar    Prophylaxis: SCD  , Protonix  CODE STATUS: full code  Other plan as per orders.  I have spent a total of 55 min on this admission  Shalon Councilman 03/15/2014, 12:22 AM

## 2014-03-15 NOTE — ED Notes (Signed)
Patient transported back from CT 

## 2014-03-15 NOTE — Care Management Note (Signed)
    Page 1 of 1   03/18/2014     11:43:40 AM   CARE MANAGEMENT NOTE 03/18/2014  [name]   Account Number:  [id number]  Date Initiated:  03/15/2014  Documentation initiated by:  GRAVES-BIGELOW,King Pinzon  Subjective/Objective Assessment:   Pt admitted for diarrhea. Pt is positive for c diff. On IV flagyl.     Action/Plan:   CM will continue to monitor for disposition needs.   Anticipated DC Date:  03/17/2014   Anticipated DC Plan:  HOME/SELF CARE      DC Planning Services  CM consult  Medication Assistance      Choice offered to / List presented to:             Status of service:  Completed, signed off Medicare Important Message given?   (If response is "NO", the following Medicare IM given date fields will be blank) Date Medicare IM given:   Date Additional Medicare IM given:    Discharge Disposition:  HOME/SELF CARE  Per UR Regulation:  Reviewed for med. necessity/level of care/duration of stay  If discussed at Long Length of Stay Meetings, dates discussed:    Comments:   1141 03-18-14 for flagyl PER REP AT [facility]: NO AUTH REQUIRED BRAND: $384.00 GENERIC: $15.00 PAT[facility]: [facility], [facility]   1040 03-18-14 Tomi BambergerBrenda Graves- Bigelow, [place] [phone] Benefits check in process for medication vancomycin. Pt will need f/u PCP and lab appointments at d/c. CM did try to touch base with wife in regards to Insurance. [facility] and the medication is available in capsule and liquid forms. CM will continue to monitor.

## 2014-03-15 NOTE — Progress Notes (Signed)
CRITICAL VALUE ALERT  Critical value received:  D. Diff positive  Date of notification:  03/15/2014  Time of notification:  0753  Critical value read back:yes  Nurse who received alert:  Wilfred CurtisMilford, Alann Avey Marie   MD notified (1st page):  Dr. Jerral RalphGhimire  Time of first page:    MD notified (2nd page):  Time of second page:  Responding MD:  Dr. Jerral RalphGhimire  Time MD responded: 07:56

## 2014-03-15 NOTE — Progress Notes (Signed)
PATIENT DETAILS Name: [name] [name] Age: 58 y.o. Sex: male Date of Birth: [birth year] Admit Date: 03/14/2014 Admitting Physician Therisa Doyne, MD PCP:No PCP Per Patient  Subjective: Diarrhea better. No vomiting. Continues to have some mild abdominal discomfort.  Assessment/Plan: Active Problems: C. difficile colitis - Diarrhea seems to be slowing down, no vomiting, tolerating full liquids, will change from IV Flagyl to oral Flagyl. - Start probiotic - Follow clinical course  Hypertension - Controlled with Cozaar  Dehydration - Resolved with IV fluids. Not tolerating full liquids  Hypokalemia - Will recheck in a.m.  Nephrolithiasis - CT scan of the abdomen shows multiple non-obstructing renal stones. For outpatient followup and monitoring by PCP.    Disposition: Remain inpatient  DVT Prophylaxis:  SCD's  Code Status: Full code  Family Communication Spouse at bedside  Procedures:  None  CONSULTS:  None  Time spent 40 minutes-which includes 50% of the time with face-to-face with patient/ family and coordinating care related to the above assessment and plan.    MEDICATIONS: Scheduled Meds: . busPIRone  15 mg Oral Daily  . escitalopram  20 mg Oral Daily  . losartan  100 mg Oral Daily  . metronidazole  500 mg Intravenous Q8H  . ondansetron (ZOFRAN) IV  4 mg Intravenous Once  . pantoprazole  40 mg Oral QAC breakfast  . saccharomyces boulardii  250 mg Oral BID  . sodium chloride  3 mL Intravenous Q12H   Continuous Infusions:  PRN Meds:.acetaminophen, acetaminophen, ALPRAZolam, HYDROcodone-acetaminophen, ondansetron (ZOFRAN) IV, ondansetron  Antibiotics: Anti-infectives   Start     Dose/Rate Route Frequency Ordered Stop   03/15/14 0400  metroNIDAZOLE (FLAGYL) IVPB 500 mg     500 mg 100 mL/hr over 60 Minutes Intravenous Every 8 hours 03/15/14 0340     03/14/14 2200  ciprofloxacin (CIPRO) tablet 500 mg     500 mg Oral  Once 03/14/14 2147  03/14/14 2244   03/14/14 2200  metroNIDAZOLE (FLAGYL) tablet 500 mg     500 mg Oral  Once 03/14/14 2147 03/14/14 2244       PHYSICAL EXAM: Vital signs in last 24 hours: Filed Vitals:   03/15/14 0315 03/15/14 0333 03/15/14 0947 03/15/14 1247  BP: 139/68 137/66 122/78 129/77  Pulse: 73 68 68 63  Temp: 99 F (37.2 C) 99 F (37.2 C)  98.2 F (36.8 C)  TempSrc: Oral Oral  Oral  Resp: 20 18  18   Height:  5\' 7"  (1.702 m)    Weight:  100.835 kg (222 lb 4.8 oz)    SpO2: 95% 98%  97%    Weight change:  Filed Weights   03/14/14 1933 03/15/14 0333  Weight: 100.387 kg (221 lb 5 oz) 100.835 kg (222 lb 4.8 oz)   Body mass index is 34.81 kg/(m^2).   Gen Exam: Awake and alert with clear speech.   Neck: Supple, No JVD.   Chest: B/L Clear.   CVS: S1 S2 Regular, no murmurs.  Abdomen: soft, BS +, non tender, non distended. Extremities: no edema, lower extremities warm to touch. Neurologic: Non Focal.   Skin: No Rash.   Wounds: N/A.   Intake/Output from previous day:  Intake/Output Summary (Last 24 hours) at 03/15/14 1537 Last data filed at 03/15/14 0553  Gross per 24 hour  Intake 366.67 ml  Output      0 ml  Net 366.67 ml     LAB RESULTS: CBC  Recent Labs Lab 03/14/14 1935 03/15/14  0540  WBC 12.9* 9.2  HGB 18.0* 14.6  HCT 51.2 42.4  PLT 276 197  MCV 86.1 85.3  MCH 30.3 29.4  MCHC 35.2 34.4  RDW 14.6 14.6  LYMPHSABS 1.5  --   MONOABS 1.8*  --   EOSABS 0.0  --   BASOSABS 0.0  --     Chemistries   Recent Labs Lab 03/14/14 1935 03/15/14 0540  NA 139 141  K 3.2* 3.0*  CL 99 103  CO2 23 20  GLUCOSE 123* 119*  BUN 7 7  CREATININE 0.97 0.76  CALCIUM 9.4 8.0*  MG  --  1.9    CBG: No results found for this basename: GLUCAP,  in the last 168 hours  GFR Estimated Creatinine Clearance: 113.9 ml/min (by C-G formula based on Cr of 0.76).  Coagulation profile No results found for this basename: INR, PROTIME,  in the last 168 hours  Cardiac Enzymes No  results found for this basename: CK, CKMB, TROPONINI, MYOGLOBIN,  in the last 168 hours  No components found with this basename: POCBNP,  No results found for this basename: DDIMER,  in the last 72 hours No results found for this basename: HGBA1C,  in the last 72 hours No results found for this basename: CHOL, HDL, LDLCALC, TRIG, CHOLHDL, LDLDIRECT,  in the last 72 hours  Recent Labs  03/15/14 0540  TSH 0.976   No results found for this basename: VITAMINB12, FOLATE, FERRITIN, TIBC, IRON, RETICCTPCT,  in the last 72 hours No results found for this basename: LIPASE, AMYLASE,  in the last 72 hours  Urine Studies No results found for this basename: UACOL, UAPR, USPG, UPH, UTP, UGL, UKET, UBIL, UHGB, UNIT, UROB, ULEU, UEPI, UWBC, URBC, UBAC, CAST, CRYS, UCOM, BILUA,  in the last 72 hours  MICROBIOLOGY: Recent Results (from the past 240 hour(s))  URINE CULTURE     Status: None   Collection Time    03/06/14  8:49 PM      Result Value Ref Range Status   Specimen Description URINE, CATHETERIZED   Final   Special Requests ADDED 2342   Final   Culture  Setup Time     Final   Value: 03/07/2014 14:48     Performed at [facility]   Colony Count     Final   Value: NO GROWTH     Performed at [facility]   Culture     Final   Value: NO GROWTH     Performed at [facility]   Report Status 03/08/2014 FINAL   Final  CLOSTRIDIUM DIFFICILE BY PCR     Status: Abnormal   Collection Time    03/14/14 10:02 PM      Result Value Ref Range Status   C difficile by pcr POSITIVE (*) NEGATIVE Final   Comment: CRITICAL RESULT CALLED TO, READ BACK BY AND VERIFIED WITH:     J [facility] [place] AT 1610 03/15/14 BY K BARR    RADIOLOGY STUDIES/RESULTS: Ct Abdomen Pelvis Wo Contrast  03/06/2014   CLINICAL DATA:  History renal stones, back pain  EXAM: CT ABDOMEN AND PELVIS WITHOUT CONTRAST  TECHNIQUE: Multidetector CT imaging of the abdomen and pelvis was performed following the standard  protocol without intravenous contrast.  COMPARISON:  None.  FINDINGS: Lung bases are clear.  No pericardial fluid.  Non IV contrast images demonstrate no focal hepatic lesion. The gallbladder, pancreas, spleen, adrenal glands normal.  There is a large calculus in the left  renal pelvis measuring 10 mm (image 55, series 2). There is no significant hydronephrosis on the left. There is a linear 17 mm calcification the cortex of the left kidney. There several additional punctate 1 mm calculi within the left renal collecting systems. There is no hydroureter hydroureter on the left. No ureteral stones on the left. Right kidney is free of calculi. No right ureteral lithiasis. No bladder stones are present.  Stomach, small bowel, appendix, cecum normal. The colon and rectosigmoid colon are normal. Partial diverticulum sigmoid colon.  Abdominal or is normal caliber. No retroperitoneal periportal lymphadenopathy.  No free fluid the pelvis. The prostate gland and bladder normal. No pelvic lymphadenopathy. No aggressive osseous lesion.  IMPRESSION: 1. Large nonobstructing calculus in the left renal pelvis. 2. Additional multiple left nephrolithiasis. 3. While there is mild stranding along the proximal left ureter, there is no hydroureter or ureteral calculi present. 4. No bladder calculi. 5. No right nephrolithiasis ureterolithiasis. 6. Sigmoid diverticulosis without diverticulitis.   Electronically Signed   By: Genevive BiStewart  Edmunds M.D.   On: 03/06/2014 22:56   Dg Abd 1 View  03/14/2014   CLINICAL DATA:  Abdominal pain, distention and diarrhea  EXAM: ABDOMEN - 1 VIEW  COMPARISON:  CT abdomen / pelvis 03/06/2014  FINDINGS: Nonobstructive bowel gas pattern. Stable appearance of 9 mm stone in the expected location of the left renal pelvis. No acute osseous abnormality.  IMPRESSION: 1. Nonobstructed bowel gas pattern. 2. Similar appearance of 9 mm stone in the region of the left renal pelvis.   Electronically Signed   By: Malachy MoanHeath   McCullough M.D.   On: 03/14/2014 22:40   Ct Abdomen Pelvis W Contrast  03/15/2014   CLINICAL DATA:  Diarrhea, nausea, febrile for 3 days.  EXAM: CT ABDOMEN AND PELVIS WITH CONTRAST  TECHNIQUE: Multidetector CT imaging of the abdomen and pelvis was performed using the standard protocol following bolus administration of intravenous contrast.  CONTRAST:  100mL OMNIPAQUE IOHEXOL 300 MG/ML  SOLN  COMPARISON:  DG ABD 1 VIEW dated 03/14/2014; CT ABD/PELV WO CM dated 03/06/2014  FINDINGS: Included view of the lung bases demonstrate right lower lobe atelectasis, slightly increased. . Visualized heart and pericardium are unremarkable.  Diffuse colonic wall thickening and edema from the cecum to the rectum with inflammatory pericolonic changes about the descending colon/sigmoid colon. No pneumatosis. Scattered sigmoid diverticula. The stomach, small bowel are normal in course and caliber without inflammatory changes. Normal appendix. Trace free fluid in the pelvis without abscess or pneumoperitoneum.  Mildly diffusely hypodense liver most consistent with fatty infiltration, the liver is otherwise unremarkable. The spleen, pancreas, gallbladder and adrenal glands are unremarkable and unchanged.  10 mm of left renal pelvis nonobstructing calculus again seen, with multiple tiny left lower pole renal calculi in total measuring approximately 17 mm. No right nephrolithiasis. Two small to characterize hypodensities in left kidney. No hydronephrosis. No renal masses. Delayed imaging demonstrates prompt symmetric excretion of contrast into the proximal urinary collecting system. Urinary bladder is decompressed and unremarkable.  Great vessels are normal in course and caliber. No lymphadenopathy by CT size criteria. Internal reproductive organs are unremarkable. Small fat containing inguinal hernias. Moderate to severe degenerative change of the right sacroiliac joint.  IMPRESSION: Diffuse colitis from the cecum to the rectum with  inflammatory changes about the sigmoid ; sigmoid diverticula, it is unclear whether this reflects superimposed diverticulitis. No bowel perforation or bowel obstruction. Trace free fluid in the pelvis is likely reactive without abscess.  Multiple nonobstructing left  nephrolithiasis.   Electronically Signed   By: Awilda Metro   On: 03/15/2014 01:55    Jeoffrey Massed, MD  [facility] Pager:[phone]  If 7PM-7AM, please contact night-coverage [email or web address] Password [email or web address] 03/15/2014, 3:37 PM   LOS: 1 day

## 2014-03-15 NOTE — Progress Notes (Signed)
UR Completed Minh Roanhorse Graves-Bigelow, RN,BSN [phone]  

## 2014-03-15 NOTE — ED Notes (Signed)
Internal Medicine at bedside; she states he will be an admission. Moving to Pod C when she is through with assessment.

## 2014-03-16 LAB — BASIC METABOLIC PANEL
BUN: 5 mg/dL — ABNORMAL LOW (ref 6–23)
CALCIUM: 8.1 mg/dL — AB (ref 8.4–10.5)
CO2: 25 mEq/L (ref 19–32)
Chloride: 107 mEq/L (ref 96–112)
Creatinine, Ser: 0.82 mg/dL (ref 0.50–1.35)
GFR calc Af Amer: >90 mL/min (ref >90)
GFR calc non Af Amer: >90 mL/min (ref >90)
GLUCOSE: 100 mg/dL — AB (ref 70–99)
POTASSIUM: 3.6 meq/L — AB (ref 3.7–5.3)
Sodium: 144 mEq/L (ref 137–147)

## 2014-03-16 NOTE — Progress Notes (Signed)
PATIENT DETAILS Name: [name] [name] Age: 58 y.o. Sex: male Date of Birth: 1956/04/06 Admit Date: 03/14/2014 Admitting Physician Therisa Doyne, MD PCP:No PCP Per Patient  Subjective: Diarrhea better- only one episode of loose stools since 7 PM yesterday. Abdominal pain significantly better, tolerating full liquids. Family inquiring about whether vancomycin would be a better choice.  Assessment/Plan: Active Problems: C. difficile colitis - Diarrhea seems to be slowing down, no vomiting, tolerating full liquids- change to soft diet  - Continue with oral Flagyl-Day 2, spoke at length with patient and family, explained that Flagyl seems to be working as patient has improved, however if they wanted me to switch him over to oral vancomycin I had no problems. Family at this time willing to wait and watch to see how he progresses over the next few days. - Start probiotic - Follow clinical course  Hypertension - Controlled with Cozaar  Dehydration - Resolved with IV fluids. Now tolerating full liquids being advanced to soft diet  Hypokalemia - Resolved with correction and slowing down of the diarrhea  Nephrolithiasis - CT scan of the abdomen shows multiple non-obstructing renal stones. For outpatient followup and monitoring by PCP.    Disposition: Remain inpatient  DVT Prophylaxis:  SCD's  Code Status: Full code  Family Communication Spouse at bedside  Procedures:  None  CONSULTS:  None  Time spent 40 minutes-which includes 50% of the time with face-to-face with patient/ family and coordinating care related to the above assessment and plan.  MEDICATIONS: Scheduled Meds: . busPIRone  15 mg Oral Daily  . escitalopram  20 mg Oral Daily  . losartan  100 mg Oral Daily  . metroNIDAZOLE  500 mg Oral 3 times per day  . ondansetron (ZOFRAN) IV  4 mg Intravenous Once  . pantoprazole  40 mg Oral QAC breakfast  . saccharomyces boulardii  250 mg Oral BID  . sodium  chloride  3 mL Intravenous Q12H   Continuous Infusions:  PRN Meds:.acetaminophen, acetaminophen, ALPRAZolam, HYDROcodone-acetaminophen, ondansetron (ZOFRAN) IV, ondansetron  Antibiotics: Anti-infectives   Start     Dose/Rate Route Frequency Ordered Stop   03/15/14 1545  metroNIDAZOLE (FLAGYL) tablet 500 mg     500 mg Oral 3 times per day 03/15/14 1540     03/15/14 0400  metroNIDAZOLE (FLAGYL) IVPB 500 mg  Status:  Discontinued     500 mg 100 mL/hr over 60 Minutes Intravenous Every 8 hours 03/15/14 0340 03/15/14 1540   03/14/14 2200  ciprofloxacin (CIPRO) tablet 500 mg     500 mg Oral  Once 03/14/14 2147 03/14/14 2244   03/14/14 2200  metroNIDAZOLE (FLAGYL) tablet 500 mg     500 mg Oral  Once 03/14/14 2147 03/14/14 2244       PHYSICAL EXAM: Vital signs in last 24 hours: Filed Vitals:   03/15/14 0947 03/15/14 1247 03/15/14 2111 03/16/14 0613  BP: 122/78 129/77 139/72 131/71  Pulse: 68 63 66 57  Temp:  98.2 F (36.8 C) 98.6 F (37 C) 97.8 F (36.6 C)  TempSrc:  Oral Oral Oral  Resp:  18 18 18   Height:      Weight:      SpO2:  97% 97% 98%    Weight change:  Filed Weights   03/14/14 1933 03/15/14 0333  Weight: 100.387 kg (221 lb 5 oz) 100.835 kg (222 lb 4.8 oz)   Body mass index is 34.81 kg/(m^2).   Gen Exam: Awake and alert with clear  speech.   Neck: Supple, No JVD.   Chest: B/L Clear.   CVS: S1 S2 Regular, no murmurs.  Abdomen: soft, BS +, non tender, non distended. Extremities: no edema, lower extremities warm to touch. Neurologic: Non Focal.   Skin: No Rash.   Wounds: N/A.   Intake/Output from previous day:  Intake/Output Summary (Last 24 hours) at 03/16/14 1023 Last data filed at 03/15/14 2139  Gross per 24 hour  Intake    963 ml  Output      0 ml  Net    963 ml     LAB RESULTS: CBC  Recent Labs Lab 03/14/14 1935 03/15/14 0540  WBC 12.9* 9.2  HGB 18.0* 14.6  HCT 51.2 42.4  PLT 276 197  MCV 86.1 85.3  MCH 30.3 29.4  MCHC 35.2 34.4  RDW  14.6 14.6  LYMPHSABS 1.5  --   MONOABS 1.8*  --   EOSABS 0.0  --   BASOSABS 0.0  --     Chemistries   Recent Labs Lab 03/14/14 1935 03/15/14 0540 03/16/14 0540  NA 139 141 144  K 3.2* 3.0* 3.6*  CL 99 103 107  CO2 23 20 25   GLUCOSE 123* 119* 100*  BUN 7 7 5*  CREATININE 0.97 0.76 0.82  CALCIUM 9.4 8.0* 8.1*  MG  --  1.9  --     CBG: No results found for this basename: GLUCAP,  in the last 168 hours  GFR Estimated Creatinine Clearance: 111.1 ml/min (by C-G formula based on Cr of 0.82).  Coagulation profile No results found for this basename: INR, PROTIME,  in the last 168 hours  Cardiac Enzymes No results found for this basename: CK, CKMB, TROPONINI, MYOGLOBIN,  in the last 168 hours  No components found with this basename: POCBNP,  No results found for this basename: DDIMER,  in the last 72 hours No results found for this basename: HGBA1C,  in the last 72 hours No results found for this basename: CHOL, HDL, LDLCALC, TRIG, CHOLHDL, LDLDIRECT,  in the last 72 hours  Recent Labs  03/15/14 0540  TSH 0.976   No results found for this basename: VITAMINB12, FOLATE, FERRITIN, TIBC, IRON, RETICCTPCT,  in the last 72 hours No results found for this basename: LIPASE, AMYLASE,  in the last 72 hours  Urine Studies No results found for this basename: UACOL, UAPR, USPG, UPH, UTP, UGL, UKET, UBIL, UHGB, UNIT, UROB, ULEU, UEPI, UWBC, URBC, UBAC, CAST, CRYS, UCOM, BILUA,  in the last 72 hours  MICROBIOLOGY: Recent Results (from the past 240 hour(s))  URINE CULTURE     Status: None   Collection Time    03/06/14  8:49 PM      Result Value Ref Range Status   Specimen Description URINE, CATHETERIZED   Final   Special Requests ADDED 2342   Final   Culture  Setup Time     Final   Value: 03/07/2014 14:48     Performed at [facility]   Colony Count     Final   Value: NO GROWTH     Performed at [facility]   Culture     Final   Value: NO GROWTH      Performed at [facility]   Report Status 03/08/2014 FINAL   Final  CLOSTRIDIUM DIFFICILE BY PCR     Status: Abnormal   Collection Time    03/14/14 10:02 PM      Result Value Ref Range  Status   C difficile by pcr POSITIVE (*) NEGATIVE Final   Comment: CRITICAL RESULT CALLED TO, READ BACK BY AND VERIFIED WITH:     [facility],RN AT [id number] 03/15/14 BY K BARR    RADIOLOGY STUDIES/RESULTS: Ct Abdomen Pelvis Wo Contrast  03/06/2014   CLINICAL DATA:  History renal stones, back pain  EXAM: CT ABDOMEN AND PELVIS WITHOUT CONTRAST  TECHNIQUE: Multidetector CT imaging of the abdomen and pelvis was performed following the standard protocol without intravenous contrast.  COMPARISON:  None.  FINDINGS: Lung bases are clear.  No pericardial fluid.  Non IV contrast images demonstrate no focal hepatic lesion. The gallbladder, pancreas, spleen, adrenal glands normal.  There is a large calculus in the left renal pelvis measuring 10 mm (image 55, series 2). There is no significant hydronephrosis on the left. There is a linear 17 mm calcification the cortex of the left kidney. There several additional punctate 1 mm calculi within the left renal collecting systems. There is no hydroureter hydroureter on the left. No ureteral stones on the left. Right kidney is free of calculi. No right ureteral lithiasis. No bladder stones are present.  Stomach, small bowel, appendix, cecum normal. The colon and rectosigmoid colon are normal. Partial diverticulum sigmoid colon.  Abdominal or is normal caliber. No retroperitoneal periportal lymphadenopathy.  No free fluid the pelvis. The prostate gland and bladder normal. No pelvic lymphadenopathy. No aggressive osseous lesion.  IMPRESSION: 1. Large nonobstructing calculus in the left renal pelvis. 2. Additional multiple left nephrolithiasis. 3. While there is mild stranding along the proximal left ureter, there is no hydroureter or ureteral calculi present. 4. No bladder calculi. 5. No  right nephrolithiasis ureterolithiasis. 6. Sigmoid diverticulosis without diverticulitis.   Electronically Signed   By: Genevive BiStewart  Edmunds M.D.   On: 03/06/2014 22:56   Dg Abd 1 View  03/14/2014   CLINICAL DATA:  Abdominal pain, distention and diarrhea  EXAM: ABDOMEN - 1 VIEW  COMPARISON:  CT abdomen / pelvis 03/06/2014  FINDINGS: Nonobstructive bowel gas pattern. Stable appearance of 9 mm stone in the expected location of the left renal pelvis. No acute osseous abnormality.  IMPRESSION: 1. Nonobstructed bowel gas pattern. 2. Similar appearance of 9 mm stone in the region of the left renal pelvis.   Electronically Signed   By: Malachy MoanHeath  McCullough M.D.   On: 03/14/2014 22:40   Ct Abdomen Pelvis W Contrast  03/15/2014   CLINICAL DATA:  Diarrhea, nausea, febrile for 3 days.  EXAM: CT ABDOMEN AND PELVIS WITH CONTRAST  TECHNIQUE: Multidetector CT imaging of the abdomen and pelvis was performed using the standard protocol following bolus administration of intravenous contrast.  CONTRAST:  100mL OMNIPAQUE IOHEXOL 300 MG/ML  SOLN  COMPARISON:  DG ABD 1 VIEW dated 03/14/2014; CT ABD/PELV WO CM dated 03/06/2014  FINDINGS: Included view of the lung bases demonstrate right lower lobe atelectasis, slightly increased. . Visualized heart and pericardium are unremarkable.  Diffuse colonic wall thickening and edema from the cecum to the rectum with inflammatory pericolonic changes about the descending colon/sigmoid colon. No pneumatosis. Scattered sigmoid diverticula. The stomach, small bowel are normal in course and caliber without inflammatory changes. Normal appendix. Trace free fluid in the pelvis without abscess or pneumoperitoneum.  Mildly diffusely hypodense liver most consistent with fatty infiltration, the liver is otherwise unremarkable. The spleen, pancreas, gallbladder and adrenal glands are unremarkable and unchanged.  10 mm of left renal pelvis nonobstructing calculus again seen, with multiple tiny left lower pole  renal calculi  in total measuring approximately 17 mm. No right nephrolithiasis. Two small to characterize hypodensities in left kidney. No hydronephrosis. No renal masses. Delayed imaging demonstrates prompt symmetric excretion of contrast into the proximal urinary collecting system. Urinary bladder is decompressed and unremarkable.  Great vessels are normal in course and caliber. No lymphadenopathy by CT size criteria. Internal reproductive organs are unremarkable. Small fat containing inguinal hernias. Moderate to severe degenerative change of the right sacroiliac joint.  IMPRESSION: Diffuse colitis from the cecum to the rectum with inflammatory changes about the sigmoid ; sigmoid diverticula, it is unclear whether this reflects superimposed diverticulitis. No bowel perforation or bowel obstruction. Trace free fluid in the pelvis is likely reactive without abscess.  Multiple nonobstructing left nephrolithiasis.   Electronically Signed   By: Awilda Metro   On: 03/15/2014 01:55    Jeoffrey Massed, MD  [facility] Pager:[phone]  If 7PM-7AM, please contact night-coverage [email or web address] Password [email or web address] 03/16/2014, 10:23 AM   LOS: 2 days

## 2014-03-17 DIAGNOSIS — A0472 Enterocolitis due to Clostridium difficile, not specified as recurrent: Principal | ICD-10-CM

## 2014-03-17 LAB — BASIC METABOLIC PANEL
BUN: 7 mg/dL (ref 6–23)
CO2: 22 mEq/L (ref 19–32)
Calcium: 7.9 mg/dL — ABNORMAL LOW (ref 8.4–10.5)
Chloride: 106 mEq/L (ref 96–112)
Creatinine, Ser: 0.73 mg/dL (ref 0.50–1.35)
GFR calc Af Amer: >90 mL/min (ref >90)
GFR calc non Af Amer: >90 mL/min (ref >90)
Glucose, Bld: 100 mg/dL — ABNORMAL HIGH (ref 70–99)
POTASSIUM: 2.8 meq/L — AB (ref 3.7–5.3)
Sodium: 144 mEq/L (ref 137–147)

## 2014-03-17 MED ORDER — POTASSIUM CHLORIDE CRYS ER 20 MEQ PO TBCR
40.0000 meq | EXTENDED_RELEASE_TABLET | ORAL | Status: AC
  Administered 2014-03-17 (×2): 40 meq via ORAL
  Filled 2014-03-17 (×2): qty 2

## 2014-03-17 NOTE — Progress Notes (Signed)
PATIENT DETAILS Name: [name] [name] Age: 58 y.o. Sex: male Date of Birth: Feb 02, 1956 Admit Date: 03/14/2014 Admitting Physician Therisa Doyne, MD PCP:No PCP Per Patient  Subjective: 6 BMs in last 24 hours-consistency has improved from watery to soft. Denies abdominal pain. Tolerating soft diet.  Assessment/Plan: Active Problems: C. 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 Plan to complete total of 14 days. -  advised to avoid unnecessary antibiotics or PPIs. Encouraged probiotic yogurt on discharge.   Hypertension - Controlled with Cozaar  Dehydration - Resolved with IV fluids. Now tolerating soft diet  Hypokalemia -  replace aggressively by mouth and follow BMP. Likely secondary to GI losses.   Nephrolithiasis - CT scan of the abdomen shows multiple non-obstructing renal stones. For outpatient followup and monitoring by PCP.    Disposition: Remain inpatient. Possible DC 4/2   DVT Prophylaxis:  SCD's  Code Status: Full code  Family Communication  none at bedside   Procedures:  None  CONSULTS:  None  Time spent 30 minutes-which includes coordinating care related to the above assessment and plan.  MEDICATIONS: Scheduled Meds: . busPIRone  15 mg Oral Daily  . escitalopram  20 mg Oral Daily  . losartan  100 mg Oral Daily  . metroNIDAZOLE  500 mg Oral 3 times per day  . ondansetron (ZOFRAN) IV  4 mg Intravenous Once  . pantoprazole  40 mg Oral QAC breakfast  . potassium chloride  40 mEq Oral Q4H  . saccharomyces boulardii  250 mg Oral BID  . sodium chloride  3 mL Intravenous Q12H   Continuous Infusions:  PRN Meds:.acetaminophen, acetaminophen, ALPRAZolam, HYDROcodone-acetaminophen, ondansetron (ZOFRAN) IV,  ondansetron  Antibiotics: Anti-infectives   Start     Dose/Rate Route Frequency Ordered Stop   03/15/14 1545  metroNIDAZOLE (FLAGYL) tablet 500 mg     500 mg Oral 3 times per day 03/15/14 1540     03/15/14 0400  metroNIDAZOLE (FLAGYL) IVPB 500 mg  Status:  Discontinued     500 mg 100 mL/hr over 60 Minutes Intravenous Every 8 hours 03/15/14 0340 03/15/14 1540   03/14/14 2200  ciprofloxacin (CIPRO) tablet 500 mg     500 mg Oral  Once 03/14/14 2147 03/14/14 2244   03/14/14 2200  metroNIDAZOLE (FLAGYL) tablet 500 mg     500 mg Oral  Once 03/14/14 2147 03/14/14 2244       PHYSICAL EXAM: Vital signs in last 24 hours: Filed Vitals:   03/16/14 0613 03/16/14 1429 03/16/14 2028 03/17/14 0500  BP: 131/71 131/67 142/68 140/70  Pulse: 57 60 68 52  Temp: 97.8 F (36.6 C) 98.3 F (36.8 C) 98 F (36.7 C) 98.5 F (36.9 C)  TempSrc: Oral Oral Axillary   Resp: 18 17  18   Height:      Weight:      SpO2: 98% 98% 97% 99%    Weight change:  Filed Weights   03/14/14 1933 03/15/14 0333  Weight: 100.387 kg (221 lb 5 oz) 100.835 kg (222 lb 4.8 oz)   Body mass index is 34.81 kg/(m^2).   Gen Exam: Awake and alert with clear speech.   Neck: Supple, No JVD.   Chest: B/L Clear.   CVS: S1 S2 Regular, no murmurs.  Telemetry: Sinus bradycardia in the 50's - sinus rhythm  Abdomen:  not distended. Mild diffuse tenderness but without peritoneal signs. Normal bowel sounds heard.  Extremities: no edema, lower extremities warm to touch. Neurologic: Non Focal.   Skin: No Rash.   Wounds: N/A.   Intake/Output from previous day: No intake or output data in the 24 hours ending 03/17/14 1101   LAB RESULTS: CBC  Recent Labs Lab 03/14/14 1935 03/15/14 0540  WBC 12.9* 9.2  HGB 18.0* 14.6  HCT 51.2 42.4  PLT 276 197  MCV 86.1 85.3  MCH 30.3 29.4  MCHC 35.2 34.4  RDW 14.6 14.6  LYMPHSABS 1.5  --   MONOABS 1.8*  --   EOSABS 0.0  --   BASOSABS 0.0  --     Chemistries   Recent Labs Lab  03/14/14 1935 03/15/14 0540 03/16/14 0540 03/17/14 0521  NA 139 141 144 144  K 3.2* 3.0* 3.6* 2.8*  CL 99 103 107 106  CO2 23 20 25 22   GLUCOSE 123* 119* 100* 100*  BUN 7 7 5* 7  CREATININE 0.97 0.76 0.82 0.73  CALCIUM 9.4 8.0* 8.1* 7.9*  MG  --  1.9  --   --     CBG: No results found for this basename: GLUCAP,  in the last 168 hours  GFR Estimated Creatinine Clearance: 113.9 ml/min (by C-G formula based on Cr of 0.73).  Coagulation profile No results found for this basename: INR, PROTIME,  in the last 168 hours  Cardiac Enzymes No results found for this basename: CK, CKMB, TROPONINI, MYOGLOBIN,  in the last 168 hours  No components found with this basename: POCBNP,  No results found for this basename: DDIMER,  in the last 72 hours No results found for this basename: HGBA1C,  in the last 72 hours No results found for this basename: CHOL, HDL, LDLCALC, TRIG, CHOLHDL, LDLDIRECT,  in the last 72 hours  Recent Labs  03/15/14 0540  TSH 0.976   No results found for this basename: VITAMINB12, FOLATE, FERRITIN, TIBC, IRON, RETICCTPCT,  in the last 72 hours No results found for this basename: LIPASE, AMYLASE,  in the last 72 hours  Urine Studies No results found for this basename: UACOL, UAPR, USPG, UPH, UTP, UGL, UKET, UBIL, UHGB, UNIT, UROB, ULEU, UEPI, UWBC, URBC, UBAC, CAST, CRYS, UCOM, BILUA,  in the last 72 hours  MICROBIOLOGY: Recent Results (from the past 240 hour(s))  CLOSTRIDIUM DIFFICILE BY PCR     Status: Abnormal   Collection Time    03/14/14 10:02 PM      Result Value Ref Range Status   C difficile by pcr POSITIVE (*) NEGATIVE Final   Comment: CRITICAL RESULT CALLED TO, READ BACK BY AND VERIFIED WITH:     J [facility] AT 1610 03/15/14 BY K BARR  STOOL CULTURE     Status: None   Collection Time    03/14/14 10:02 PM      Result Value Ref Range Status   Specimen Description STOOL   Final   Special Requests NONE   Final   Culture     Final   Value: NO  SUSPICIOUS COLONIES, CONTINUING TO HOLD     Performed at [facility]   Report Status PENDING   Incomplete    RADIOLOGY STUDIES/RESULTS: Ct Abdomen Pelvis Wo Contrast  03/06/2014   CLINICAL DATA:  History renal stones, back pain  EXAM: CT ABDOMEN AND PELVIS WITHOUT CONTRAST  TECHNIQUE: Multidetector CT imaging  of the abdomen and pelvis was performed following the standard protocol without intravenous contrast.  COMPARISON:  None.  FINDINGS: Lung bases are clear.  No pericardial fluid.  Non IV contrast images demonstrate no focal hepatic lesion. The gallbladder, pancreas, spleen, adrenal glands normal.  There is a large calculus in the left renal pelvis measuring 10 mm (image 55, series 2). There is no significant hydronephrosis on the left. There is a linear 17 mm calcification the cortex of the left kidney. There several additional punctate 1 mm calculi within the left renal collecting systems. There is no hydroureter hydroureter on the left. No ureteral stones on the left. Right kidney is free of calculi. No right ureteral lithiasis. No bladder stones are present.  Stomach, small bowel, appendix, cecum normal. The colon and rectosigmoid colon are normal. Partial diverticulum sigmoid colon.  Abdominal or is normal caliber. No retroperitoneal periportal lymphadenopathy.  No free fluid the pelvis. The prostate gland and bladder normal. No pelvic lymphadenopathy. No aggressive osseous lesion.  IMPRESSION: 1. Large nonobstructing calculus in the left renal pelvis. 2. Additional multiple left nephrolithiasis. 3. While there is mild stranding along the proximal left ureter, there is no hydroureter or ureteral calculi present. 4. No bladder calculi. 5. No right nephrolithiasis ureterolithiasis. 6. Sigmoid diverticulosis without diverticulitis.   Electronically Signed   By: Genevive Bi M.D.   On: 03/06/2014 22:56   Dg Abd 1 View  03/14/2014   CLINICAL DATA:  Abdominal pain, distention and diarrhea   EXAM: ABDOMEN - 1 VIEW  COMPARISON:  CT abdomen / pelvis 03/06/2014  FINDINGS: Nonobstructive bowel gas pattern. Stable appearance of 9 mm stone in the expected location of the left renal pelvis. No acute osseous abnormality.  IMPRESSION: 1. Nonobstructed bowel gas pattern. 2. Similar appearance of 9 mm stone in the region of the left renal pelvis.   Electronically Signed   By: Malachy Moan M.D.   On: 03/14/2014 22:40   Ct Abdomen Pelvis W Contrast  03/15/2014   CLINICAL DATA:  Diarrhea, nausea, febrile for 3 days.  EXAM: CT ABDOMEN AND PELVIS WITH CONTRAST  TECHNIQUE: Multidetector CT imaging of the abdomen and pelvis was performed using the standard protocol following bolus administration of intravenous contrast.  CONTRAST:  OMNIPAQUE IOHEXOL 300 MG/ML  SOLN  COMPARISON:  DG ABD 1 VIEW dated 03/14/2014; CT ABD/PELV WO CM dated 03/06/2014  FINDINGS: Included view of the lung bases demonstrate right lower lobe atelectasis, slightly increased. . Visualized heart and pericardium are unremarkable.  Diffuse colonic wall thickening and edema from the cecum to the rectum with inflammatory pericolonic changes about the descending colon/sigmoid colon. No pneumatosis. Scattered sigmoid diverticula. The stomach, small bowel are normal in course and caliber without inflammatory changes. Normal appendix. Trace free fluid in the pelvis without abscess or pneumoperitoneum.  Mildly diffusely hypodense liver most consistent with fatty infiltration, the liver is otherwise unremarkable. The spleen, pancreas, gallbladder and adrenal glands are unremarkable and unchanged.  10 mm of left renal pelvis nonobstructing calculus again seen, with multiple tiny left lower pole renal calculi in total measuring approximately 17 mm. No right nephrolithiasis. Two small to characterize hypodensities in left kidney. No hydronephrosis. No renal masses. Delayed imaging demonstrates prompt symmetric excretion of contrast into the proximal  urinary collecting system. Urinary bladder is decompressed and unremarkable.  Great vessels are normal in course and caliber. No lymphadenopathy by CT size criteria. Internal reproductive organs are unremarkable. Small fat containing inguinal hernias. Moderate to severe degenerative  change of the right sacroiliac joint.  IMPRESSION: Diffuse colitis from the cecum to the rectum with inflammatory changes about the sigmoid ; sigmoid diverticula, it is unclear whether this reflects superimposed diverticulitis. No bowel perforation or bowel obstruction. Trace free fluid in the pelvis is likely reactive without abscess.  Multiple nonobstructing left nephrolithiasis.   Electronically Signed   By: Awilda Metroourtnay  Bloomer   On: 03/15/2014 01:55    Marcellus ScottHONGALGI,Rochell Mabie, MD, FACP, FHM. [facility] Pager [phone]  If 7PM-7AM, please contact night-coverage [email or web address] Password [email or web address] 03/17/2014, 11:09 AM   LOS: 3 days

## 2014-03-17 NOTE — Progress Notes (Signed)
CRITICAL VALUE ALERT  Critical value received: potassium 2.8  Date of notification: 03/17/2014  Time of notification:  0702  Critical value read back:yes  Nurse who received alert:  Jacklyn ShellAnne Lysa Livengood RN  MD notified (1st page):  Dr.Hongalgi  Time of first page:  0710  MD notified (2nd page):  Time of second page:  Responding MD:  Dr.Hongalgi  Time MD responded: [phone]

## 2014-03-18 LAB — BASIC METABOLIC PANEL
BUN: 7 mg/dL (ref 6–23)
CHLORIDE: 107 meq/L (ref 96–112)
CO2: 24 meq/L (ref 19–32)
Calcium: 8.1 mg/dL — ABNORMAL LOW (ref 8.4–10.5)
Creatinine, Ser: 0.8 mg/dL (ref 0.50–1.35)
GFR calc Af Amer: >90 mL/min (ref >90)
GFR calc non Af Amer: >90 mL/min (ref >90)
Glucose, Bld: 98 mg/dL (ref 70–99)
Potassium: 4 mEq/L (ref 3.7–5.3)
Sodium: 143 mEq/L (ref 137–147)

## 2014-03-18 MED ORDER — INDOMETHACIN 50 MG PO CAPS: 50.0000 mg | ORAL_CAPSULE | Freq: Three times a day (TID) | ORAL | Status: DC | PRN

## 2014-03-18 MED ORDER — INDOMETHACIN 50 MG PO CAPS
50.0000 mg | ORAL_CAPSULE | Freq: Three times a day (TID) | ORAL | Status: DC | PRN
  Administered 2014-03-18: 50 mg via ORAL
  Filled 2014-03-18 (×3): qty 1

## 2014-03-18 MED ORDER — METRONIDAZOLE 500 MG PO TABS: 500.0000 mg | ORAL_TABLET | Freq: Three times a day (TID) | ORAL | Status: DC

## 2014-03-18 NOTE — Discharge Summary (Addendum)
Physician Discharge Summary  [name] [name] UJW:[id number] DOB: [birth year] DOA: 03/14/2014  PCP: No PCP Per Patient  Admit date: 03/14/2014 Discharge date: 03/18/2014  Time spent: Less than 30 minutes  Recommendations for Outpatient Follow-up:  1. [facility] on 03/22/14 at 10 AM with repeat labs (BMP). Follow final stool culture sensitivity results. 2. He may return to work after he has been without diarrhea for 48 hours. (letter provided).   Discharge Diagnoses:  Active Problems:   Diarrhea   Kidney stone   Hypokalemia   Dehydration   Hypertension   C. 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 CT abdomen was suggestive of pancolitis. C. difficile PCR was positive.  Hospital Course:   1. C. 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 difficile colitis. Admitted to the hospital and started empirically on oral Flagyl. He has progressively done well with improvement in stool consistency and decrease in stool frequency. He had 6 BMs yesterday but none since midnight and then one BM this morning which is loose but not watery as on admission. He denies abdominal pain. Appetite is improving. He was concerned regarding recurrence of C. difficile and was debating continuing Flagyl versus trying oral vancomycin from his research. Discussed with infectious disease M.D. on call who stated that it was reasonable to continue on Flagyl since he was getting  better on it. However he could be switched to oral vancomycin if that's what patient desired. Patient however has finally decided to continue with Flagyl. He has been counseled regarding avoiding unnecessary antibiotics, PPIs. He is also been counseled to use over-the-counter probiotic yogurts. He verbalizes understanding. Plan to complete total of 14 days of oral Flagyl. Stool cultures are negative to date. 2. Hypertension: Controlled with Cozaar. 3. Hypokalemia: Aggressively replaced. Followup BMP in a couple of days as outpatient. 4. Mild acute gout of left ankle: Patient states that he gets this when he does not drink adequate fluids as he is currently doing. He states that he takes as needed colchicine and indomethacin which he has at home. He is advised to continue when necessary Indocin but avoid colchicine secondary to current GI issues. 5. Left Nephrolithiasis: CT scan of abdomen showed multiple nonobstructing renal stones which can be followed up outpatient by his PCP in [place].   He states that he's going to be in this area for a month or 2 on work assignment. Case management has arranged for followup with the [facility] for followup on labs.  Consultations:  None  Procedures:  None    Discharge Exam:  Complaints:  6 loose BMs yesterday but none after midnight until this morning when he had one loose BM. No nausea or vomiting. No abdominal pain. Left ankle mild swelling and pain consistent with prior episodes of gout flare.  Filed Vitals:   03/17/14 1418 03/17/14 2100 03/18/14 0500 03/18/14 1007  BP: 125/58 137/74 136/73 130/80  Pulse: 61 70 58 66  Temp: 98 F (36.7  C) 98.3 F (36.8 C) 98.7 F (37.1 C)   TempSrc: Oral     Resp: 18 18 16    Height:      Weight:      SpO2: 100% 97% 98%     General exam: Pleasant middle-age male lying comfortably in bed Respiratory system: Clear. No increased work of breathing. Cardiovascular system: S1 & S2 heard, RRR. No JVD,  murmurs, gallops, clicks or pedal edema. Telemetry: Sinus bradycardia in the 50s-sinus rhythm   Gastrointestinal system: Abdomen is nondistended, soft and nontender. Normal bowel sounds heard. Central nervous system: Alert and oriented. No focal neurological deficits. Extremities: Symmetric 5 x 5 power. left ankle with mild swelling, warmth, tenderness and painful range of motion but no erythema or findings to suggest cellulitis.  Discharge Instructions      Discharge Orders   Future Orders Complete By Expires   Call MD for:  extreme fatigue  As directed    Call MD for:  persistant dizziness or light-headedness  As directed    Call MD for:  persistant nausea and vomiting  As directed    Call MD for:  severe uncontrolled pain  As directed    Call MD for:  temperature >100.4  As directed    Diet - low sodium heart healthy  As directed    Increase activity slowly  As directed        Medication List         ALPRAZolam 0.25 MG tablet  Commonly known as:  XANAX  Take 0.25 mg by mouth at bedtime as needed for sleep.     busPIRone 15 MG tablet  Commonly known as:  BUSPAR  Take 15 mg by mouth daily.     cholecalciferol 1000 UNITS tablet  Commonly known as:  VITAMIN D  Take 1,000 Units by mouth daily.     escitalopram 20 MG tablet  Commonly known as:  LEXAPRO  Take 20 mg by mouth daily.     folic acid 400 MCG tablet  Commonly known as:  FOLVITE  Take 400 mcg by mouth daily.     indomethacin 50 MG capsule  Commonly known as:  INDOCIN  Take 1 capsule (50 mg total) by mouth 3 (three) times daily as needed for mild pain or moderate pain (For 3 days for gout pain).     losartan 100 MG tablet  Commonly known as:  COZAAR  Take 100 mg by mouth daily.     MAGNESIUM PO  Take 400 mg by mouth daily.     metroNIDAZOLE 500 MG tablet  Commonly known as:  FLAGYL  Take 1 tablet (500 mg total) by mouth 3 (three) times daily.     OVER THE COUNTER MEDICATION  Take 1 tablet by mouth  daily. Zinc     VISINE OP  Place 1 drop into both eyes daily.       Follow-up Information   [facility]     On 03/23/2014. (To be seen with repeat labs (BMP).)    Contact information:   [address] [place] [id number] [phone]       The results of significant diagnostics from this hospitalization (including imaging, microbiology, ancillary and laboratory) are listed below for reference.    Significant Diagnostic Studies: Ct Abdomen Pelvis Wo Contrast  03/06/2014   CLINICAL DATA:  History renal stones, back pain  EXAM: CT ABDOMEN AND PELVIS WITHOUT CONTRAST  TECHNIQUE: Multidetector CT  imaging of the abdomen and pelvis was performed following the standard protocol without intravenous contrast.  COMPARISON:  None.  FINDINGS: Lung bases are clear.  No pericardial fluid.  Non IV contrast images demonstrate no focal hepatic lesion. The gallbladder, pancreas, spleen, adrenal glands normal.  There is a large calculus in the left renal pelvis measuring 10 mm (image 55, series 2). There is no significant hydronephrosis on the left. There is a linear 17 mm calcification the cortex of the left kidney. There several additional punctate 1 mm calculi within the left renal collecting systems. There is no hydroureter hydroureter on the left. No ureteral stones on the left. Right kidney is free of calculi. No right ureteral lithiasis. No bladder stones are present.  Stomach, small bowel, appendix, cecum normal. The colon and rectosigmoid colon are normal. Partial diverticulum sigmoid colon.  Abdominal or is normal caliber. No retroperitoneal periportal lymphadenopathy.  No free fluid the pelvis. The prostate gland and bladder normal. No pelvic lymphadenopathy. No aggressive osseous lesion.  IMPRESSION: 1. Large nonobstructing calculus in the left renal pelvis. 2. Additional multiple left nephrolithiasis. 3. While there is mild stranding along the  proximal left ureter, there is no hydroureter or ureteral calculi present. 4. No bladder calculi. 5. No right nephrolithiasis ureterolithiasis. 6. Sigmoid diverticulosis without diverticulitis.   Electronically Signed   By: Genevive Bi M.D.   On: 03/06/2014 22:56   Dg Abd 1 View  03/14/2014   CLINICAL DATA:  Abdominal pain, distention and diarrhea  EXAM: ABDOMEN - 1 VIEW  COMPARISON:  CT abdomen / pelvis 03/06/2014  FINDINGS: Nonobstructive bowel gas pattern. Stable appearance of 9 mm stone in the expected location of the left renal pelvis. No acute osseous abnormality.  IMPRESSION: 1. Nonobstructed bowel gas pattern. 2. Similar appearance of 9 mm stone in the region of the left renal pelvis.   Electronically Signed   By: Malachy Moan M.D.   On: 03/14/2014 22:40   Ct Abdomen Pelvis W Contrast  03/15/2014   CLINICAL DATA:  Diarrhea, nausea, febrile for 3 days.  EXAM: CT ABDOMEN AND PELVIS WITH CONTRAST  TECHNIQUE: Multidetector CT imaging of the abdomen and pelvis was performed using the standard protocol following bolus administration of intravenous contrast.  CONTRAST:  OMNIPAQUE IOHEXOL 300 MG/ML  SOLN  COMPARISON:  DG ABD 1 VIEW dated 03/14/2014; CT ABD/PELV WO CM dated 03/06/2014  FINDINGS: Included view of the lung bases demonstrate right lower lobe atelectasis, slightly increased. . Visualized heart and pericardium are unremarkable.  Diffuse colonic wall thickening and edema from the cecum to the rectum with inflammatory pericolonic changes about the descending colon/sigmoid colon. No pneumatosis. Scattered sigmoid diverticula. The stomach, small bowel are normal in course and caliber without inflammatory changes. Normal appendix. Trace free fluid in the pelvis without abscess or pneumoperitoneum.  Mildly diffusely hypodense liver most consistent with fatty infiltration, the liver is otherwise unremarkable. The spleen, pancreas, gallbladder and adrenal glands are unremarkable and unchanged.   10 mm of left renal pelvis nonobstructing calculus again seen, with multiple tiny left lower pole renal calculi in total measuring approximately 17 mm. No right nephrolithiasis. Two small to characterize hypodensities in left kidney. No hydronephrosis. No renal masses. Delayed imaging demonstrates prompt symmetric excretion of contrast into the proximal urinary collecting system. Urinary bladder is decompressed and unremarkable.  Great vessels are normal in course and caliber. No lymphadenopathy by CT size criteria. Internal reproductive organs are unremarkable. Small fat containing inguinal hernias. Moderate to severe  degenerative change of the right sacroiliac joint.  IMPRESSION: Diffuse colitis from the cecum to the rectum with inflammatory changes about the sigmoid ; sigmoid diverticula, it is unclear whether this reflects superimposed diverticulitis. No bowel perforation or bowel obstruction. Trace free fluid in the pelvis is likely reactive without abscess.  Multiple nonobstructing left nephrolithiasis.   Electronically Signed   By: Awilda Metro   On: 03/15/2014 01:55    Microbiology: Recent Results (from the past 240 hour(s))  CLOSTRIDIUM DIFFICILE BY PCR     Status: Abnormal   Collection Time    03/14/14 10:02 PM      Result Value Ref Range Status   C difficile by pcr POSITIVE (*) NEGATIVE Final   Comment: CRITICAL RESULT CALLED TO, READ BACK BY AND VERIFIED WITH:     J [facility] AT 1610 03/15/14 BY K BARR  STOOL CULTURE     Status: None   Collection Time    03/14/14 10:02 PM      Result Value Ref Range Status   Specimen Description STOOL   Final   Special Requests NONE   Final   Culture     Final   Value: NO SUSPICIOUS COLONIES, CONTINUING TO HOLD     Performed at [facility]   Report Status PENDING   Incomplete     Labs: Basic Metabolic Panel:  Recent Labs Lab 03/14/14 1935 03/15/14 0540 03/16/14 0540 03/17/14 0521 03/18/14 0715  NA 139 141 144 144 143  K  3.2* 3.0* 3.6* 2.8* 4.0  CL 99 103 107 106 107  CO2 23 20 25 22 24   GLUCOSE 123* 119* 100* 100* 98  BUN 7 7 5* 7 7  CREATININE 0.97 0.76 0.82 0.73 0.80  CALCIUM 9.4 8.0* 8.1* 7.9* 8.1*  MG  --  1.9  --   --   --   PHOS  --  2.2*  --   --   --    Liver Function Tests:  Recent Labs Lab 03/14/14 1935 03/15/14 0540  AST 14 13  ALT 22 16  ALKPHOS 89 64  BILITOT 0.6 0.6  PROT 7.9 5.8*  ALBUMIN 3.7 2.9*   No results found for this basename: LIPASE, AMYLASE,  in the last 168 hours No results found for this basename: AMMONIA,  in the last 168 hours CBC:  Recent Labs Lab 03/14/14 1935 03/15/14 0540  WBC 12.9* 9.2  NEUTROABS 9.5*  --   HGB 18.0* 14.6  HCT 51.2 42.4  MCV 86.1 85.3  PLT 276 197   Cardiac Enzymes: No results found for this basename: CKTOTAL, CKMB, CKMBINDEX, TROPONINI,  in the last 168 hours BNP: BNP (last 3 results) No results found for this basename: PROBNP,  in the last 8760 hours CBG: No results found for this basename: GLUCAP,  in the last 168 hours    Signed:  Marcellus Scott, MD, FACP, FHM. [facility] Pager [phone]  If 7PM-7AM, please contact night-coverage [email or web address] Password [facility] 03/18/2014, 11:32 AM

## 2014-03-19 LAB — STOOL CULTURE

## 2014-03-21 NOTE — ED Provider Notes (Signed)
Medical screening examination/treatment/procedure(s) were performed by non-physician practitioner and as supervising physician I was immediately available for consultation/collaboration.   Nelia Shiobert L Valicia Rief, MD 03/21/14 [phone]

## 2014-03-22 ENCOUNTER — Encounter: Payer: Self-pay | Admitting: Internal Medicine

## 2014-03-22 ENCOUNTER — Ambulatory Visit: Payer: Worker's Compensation | Attending: Internal Medicine | Admitting: Internal Medicine

## 2014-03-22 ENCOUNTER — Ambulatory Visit: Payer: BC Managed Care – PPO | Attending: Internal Medicine

## 2014-03-22 VITALS — BP 124/84 | HR 83 | Temp 98.6°F | Resp 16 | Ht 67.0 in | Wt 223.0 lb

## 2014-03-22 DIAGNOSIS — M109 Gout, unspecified: Secondary | ICD-10-CM

## 2014-03-22 DIAGNOSIS — A0472 Enterocolitis due to Clostridium difficile, not specified as recurrent: Secondary | ICD-10-CM

## 2014-03-22 DIAGNOSIS — M25519 Pain in unspecified shoulder: Secondary | ICD-10-CM

## 2014-03-22 DIAGNOSIS — K5289 Other specified noninfective gastroenteritis and colitis: Secondary | ICD-10-CM | POA: Diagnosis not present

## 2014-03-22 DIAGNOSIS — N200 Calculus of kidney: Secondary | ICD-10-CM | POA: Insufficient documentation

## 2014-03-22 DIAGNOSIS — M25511 Pain in right shoulder: Secondary | ICD-10-CM

## 2014-03-22 DIAGNOSIS — Z0000 Encounter for general adult medical examination without abnormal findings: Secondary | ICD-10-CM

## 2014-03-22 LAB — BASIC METABOLIC PANEL
BUN: 8 mg/dL (ref 6–23)
CALCIUM: 8.7 mg/dL (ref 8.4–10.5)
CHLORIDE: 104 meq/L (ref 96–112)
CO2: 24 meq/L (ref 19–32)
CREATININE: 0.87 mg/dL (ref 0.50–1.35)
GLUCOSE: 214 mg/dL — AB (ref 70–99)
Potassium: 3.7 mEq/L (ref 3.5–5.3)
Sodium: 140 mEq/L (ref 135–145)

## 2014-03-22 MED ORDER — PREDNISONE 20 MG PO TABS: 40.0000 mg | ORAL_TABLET | Freq: Every day | ORAL | Status: DC

## 2014-03-22 MED ORDER — NYSTATIN 100000 UNIT/ML MT SUSP: 5.0000 mL | Freq: Four times a day (QID) | OROMUCOSAL | Status: DC

## 2014-03-22 MED ORDER — FLUCONAZOLE 150 MG PO TABS: 150.0000 mg | ORAL_TABLET | Freq: Once | ORAL | Status: DC

## 2014-03-22 MED ORDER — POTASSIUM CHLORIDE CRYS ER 20 MEQ PO TBCR: 20.0000 meq | EXTENDED_RELEASE_TABLET | Freq: Every day | ORAL | Status: DC

## 2014-03-22 NOTE — Progress Notes (Signed)
Patient ID: [name] [name], male   DOB: [birth year], 58 y.o.   MRN: [id number]   CC:  HPI: 58 year old male admitted for C. difficile colitis, also found to have nephrolithiasis in the left renal pelvis,, also with a history of gout, presents today with the above problems. Recently treated with Keflex for UTI Started on Flagyl due to pancolitis. Diarrhea improving slowly still has 3-4 bowel movements but they are formed. During hospitalization the patient was found to have a low potassium. This was aggressively replaced. Today the patient is here to recheck his potassium.  Also complaining of pain in his right shoulder and left ankle. Left ankle appears to be swollen. The patient has been taking colchicine and indomethacin. Also complaining of thrush in his mouth, no difficulty swallowing.      No Known Allergies Past Medical History  Diagnosis Date  . Kidney stones   . Hypertension    Current Outpatient Prescriptions on File Prior to Visit  Medication Sig Dispense Refill  . busPIRone (BUSPAR) 15 MG tablet Take 15 mg by mouth daily.      . cholecalciferol (VITAMIN D) 1000 UNITS tablet Take 1,000 Units by mouth daily.      Marland Kitchen escitalopram (LEXAPRO) 20 MG tablet Take 20 mg by mouth daily.      . folic acid (FOLVITE) 400 MCG tablet Take 400 mcg by mouth daily.      . indomethacin (INDOCIN) 50 MG capsule Take 1 capsule (50 mg total) by mouth 3 (three) times daily as needed for mild pain or moderate pain (For 3 days for gout pain).      Marland Kitchen losartan (COZAAR) 100 MG tablet Take 100 mg by mouth daily.      Marland Kitchen MAGNESIUM PO Take 400 mg by mouth daily.      . metroNIDAZOLE (FLAGYL) 500 MG tablet Take 1 tablet (500 mg total) by mouth 3 (three) times daily.  30 tablet  0  . ALPRAZolam (XANAX) 0.25 MG tablet Take 0.25 mg by mouth at bedtime as needed for sleep.      Marland Kitchen OVER THE COUNTER MEDICATION Take 1 tablet by mouth daily. Zinc      . Tetrahydrozoline HCl (VISINE OP) Place 1 drop into both eyes daily.        No current facility-administered medications on file prior to visit.   Family History  Problem Relation Age of Onset  . Cancer - Prostate Father    History   Social History  . Marital Status: Married    Spouse Name: N/A    Number of Children: N/A  . Years of Education: N/A   Occupational History  . Not on file.   Social History Main Topics  . Smoking status: Former Smoker    Quit date: 12/17/1984  . Smokeless tobacco: Not on file  . Alcohol Use: Yes     Comment: Socially  . Drug Use: No  . Sexual Activity: Not on file   Other Topics Concern  . Not on file   Social History Narrative  . No narrative on file    Review of Systems  Constitutional: Negative for fever, chills, diaphoresis, activity change, appetite change and fatigue.  HENT: Negative for ear pain, nosebleeds, congestion, facial swelling, rhinorrhea, neck pain, neck stiffness and ear discharge.   Eyes: Negative for pain, discharge, redness, itching and visual disturbance.  Respiratory: Negative for cough, choking, chest tightness, shortness of breath, wheezing and stridor.   Cardiovascular: Negative for chest pain, palpitations and leg  swelling.  Gastrointestinal: As in history of present illness Genitourinary: Negative for dysuria, urgency, frequency, hematuria, flank pain, decreased urine volume, difficulty urinating and dyspareunia.  Musculoskeletal: Negative for back pain, joint swelling, arthralgias and gait problem.  Neurological: Negative for dizziness, tremors, seizures, syncope, facial asymmetry, speech difficulty, weakness, light-headedness, numbness and headaches.  Hematological: Negative for adenopathy. Does not bruise/bleed easily.  Psychiatric/Behavioral: Negative for hallucinations, behavioral problems, confusion, dysphoric mood, decreased concentration and agitation.    Objective:   Filed Vitals:   03/22/14 1137  BP: 124/84  Pulse: 83  Temp: 98.6 F (37 C)  Resp: 16     Physical Exam  Constitutional: Appears well-developed and well-nourished. No distress.  HENT: Normocephalic. External right and left ear normal. Thrush in the oropharynx Eyes: Conjunctivae and EOM are normal. PERRLA, no scleral icterus.  Neck: Normal ROM. Neck supple. No JVD. No tracheal deviation. No thyromegaly.  CVS: RRR, S1/S2 +, no murmurs, no gallops, no carotid bruit.  Pulmonary: Effort and breath sounds normal, no stridor, rhonchi, wheezes, rales.  Abdominal: Soft. BS +,  no distension, tenderness, rebound or guarding.  Musculoskeletal: Normal range of motion. No edema and no tenderness.  Lymphadenopathy: No lymphadenopathy noted, cervical, inguinal. Neuro: Alert. Normal reflexes, muscle tone coordination. No cranial nerve deficit. Skin: Skin is warm and dry. No rash noted. Not diaphoretic. No erythema. No pallor.  Psychiatric: Normal mood and affect. Behavior, judgment, thought content normal.   Lab Results  Component Value Date   WBC 9.2 03/15/2014   HGB 14.6 03/15/2014   HCT 42.4 03/15/2014   MCV 85.3 03/15/2014   PLT 197 03/15/2014   Lab Results  Component Value Date   CREATININE 0.80 03/18/2014   BUN 7 03/18/2014   NA 143 03/18/2014   K 4.0 03/18/2014   CL 107 03/18/2014   CO2 24 03/18/2014    No results found for this basename: HGBA1C   Lipid Panel  No results found for this basename: chol, trig, hdl, cholhdl, vldl, ldlcalc       Assessment and plan:   Patient Active Problem List   Diagnosis Date Noted  . Hypertension 03/15/2014  . C. difficile colitis 03/15/2014  . Diarrhea 03/14/2014  . Kidney stone 03/14/2014  . Hypokalemia 03/14/2014  . Dehydration 03/14/2014   C. difficile colitis Continue Flagyl Continue over-the-counter probiotic,probiotic yogurts Repeat BMP today We'll provide potassium supplement prescription in case the patient's potassium is low   Acute gout flare Patient advised to stop colchicine because of the C diff  and switch to prednisone  for 7 days Continue indomethacin   Nephrolithiasis Large nonobstructing calculus in the left renal pelvis. Refer patient to urology  Patient to followup in 2-3 months      The patient was given clear instructions to go to ER or return to medical center if symptoms don't improve, worsen or new problems develop. The patient verbalized understanding. The patient was told to call to get any lab results if not heard anything in the next week.

## 2014-03-22 NOTE — Progress Notes (Signed)
Pt here to establish care for recent HFU- loose stools related to C-DIFF. Taking prescribed Flagyl 500 mg started 03/18/14 Pt states he developed Gout attack while in hospital with mouth thrush Taking medication daily. States diarrhea has improved with soft stools VSS.need Nephrology referral for Kidney stones found on CT

## 2014-03-24 ENCOUNTER — Telehealth: Payer: Self-pay | Admitting: *Deleted

## 2014-03-24 NOTE — Telephone Encounter (Signed)
Left message

## 2014-03-24 NOTE — Telephone Encounter (Signed)
Message copied by Raynelle CharyWINFREE, Janel Beane R on Wed Mar 24, 2014  5:20 PM ------      Message from: Jeanann LewandowskyJEGEDE, OLUGBEMIGA E      Created: Wed Mar 24, 2014  2:37 PM       Please inform patient that his laboratory results these were within normal except for his blood sugar was high at the time of testing. Based on this blood glucose level we will have to do a hemoglobin A1c to rule out diabetes. Please call in patient for hemoglobin A1c lab appointment ------

## 2014-04-03 ENCOUNTER — Encounter (HOSPITAL_COMMUNITY): Payer: Self-pay | Admitting: Emergency Medicine

## 2014-04-03 ENCOUNTER — Inpatient Hospital Stay (HOSPITAL_COMMUNITY)
Admission: EM | Admit: 2014-04-03 | Discharge: 2014-04-06 | DRG: 373 | Disposition: A | Discharge status: Home / Self Care | Payer: BC Managed Care – PPO | Attending: Internal Medicine | Admitting: Internal Medicine

## 2014-04-03 ENCOUNTER — Emergency Department (HOSPITAL_COMMUNITY): Payer: BC Managed Care – PPO

## 2014-04-03 DIAGNOSIS — R197 Diarrhea, unspecified: Secondary | ICD-10-CM | POA: Diagnosis not present

## 2014-04-03 DIAGNOSIS — M109 Gout, unspecified: Secondary | ICD-10-CM

## 2014-04-03 DIAGNOSIS — N200 Calculus of kidney: Secondary | ICD-10-CM

## 2014-04-03 DIAGNOSIS — E860 Dehydration: Secondary | ICD-10-CM | POA: Diagnosis not present

## 2014-04-03 DIAGNOSIS — A0472 Enterocolitis due to Clostridium difficile, not specified as recurrent: Principal | ICD-10-CM

## 2014-04-03 DIAGNOSIS — D72829 Elevated white blood cell count, unspecified: Secondary | ICD-10-CM

## 2014-04-03 DIAGNOSIS — I10 Essential (primary) hypertension: Secondary | ICD-10-CM

## 2014-04-03 DIAGNOSIS — E876 Hypokalemia: Secondary | ICD-10-CM

## 2014-04-03 DIAGNOSIS — A0471 Enterocolitis due to Clostridium difficile, recurrent: Secondary | ICD-10-CM | POA: Diagnosis present

## 2014-04-03 DIAGNOSIS — Z87891 Personal history of nicotine dependence: Secondary | ICD-10-CM

## 2014-04-03 DIAGNOSIS — Z79899 Other long term (current) drug therapy: Secondary | ICD-10-CM

## 2014-04-03 LAB — COMPREHENSIVE METABOLIC PANEL
ALBUMIN: 3.2 g/dL — AB (ref 3.5–5.2)
ALK PHOS: 90 U/L (ref 39–117)
ALT: 23 U/L (ref 0–53)
AST: 17 U/L (ref 0–37)
BUN: 9 mg/dL (ref 6–23)
CHLORIDE: 100 meq/L (ref 96–112)
CO2: 24 mEq/L (ref 19–32)
Calcium: 9.1 mg/dL (ref 8.4–10.5)
Creatinine, Ser: 0.96 mg/dL (ref 0.50–1.35)
GFR calc Af Amer: >90 mL/min (ref >90)
GFR calc non Af Amer: 90 mL/min — ABNORMAL LOW (ref >90)
Glucose, Bld: 141 mg/dL — ABNORMAL HIGH (ref 70–99)
Potassium: 3.9 mEq/L (ref 3.7–5.3)
Sodium: 138 mEq/L (ref 137–147)
Total Bilirubin: 0.6 mg/dL (ref 0.3–1.2)
Total Protein: 6.9 g/dL (ref 6.0–8.3)

## 2014-04-03 LAB — URINE MICROSCOPIC-ADD ON

## 2014-04-03 LAB — CBC WITH DIFFERENTIAL/PLATELET
Basophils Absolute: 0 10*3/uL (ref 0.0–0.1)
Basophils Relative: 0 % (ref 0–1)
EOS ABS: 0 10*3/uL (ref 0.0–0.7)
Eosinophils Relative: 0 % (ref 0–5)
HCT: 45.4 % (ref 39.0–52.0)
Hemoglobin: 15.6 g/dL (ref 13.0–17.0)
LYMPHS PCT: 4 % — AB (ref 12–46)
Lymphs Abs: 1.3 10*3/uL (ref 0.7–4.0)
MCH: 30 pg (ref 26.0–34.0)
MCHC: 34.4 g/dL (ref 30.0–36.0)
MCV: 87.3 fL (ref 78.0–100.0)
MONOS PCT: 8 % (ref 3–12)
Monocytes Absolute: 2.5 10*3/uL — ABNORMAL HIGH (ref 0.1–1.0)
NEUTROS PCT: 88 % — AB (ref 43–77)
Neutro Abs: 27.6 10*3/uL — ABNORMAL HIGH (ref 1.7–7.7)
Platelets: 242 10*3/uL (ref 150–400)
RBC: 5.2 MIL/uL (ref 4.22–5.81)
RDW: 15 % (ref 11.5–15.5)
WBC: 31.4 10*3/uL — AB (ref 4.0–10.5)

## 2014-04-03 LAB — URINALYSIS, ROUTINE W REFLEX MICROSCOPIC
GLUCOSE, UA: NEGATIVE mg/dL
KETONES UR: NEGATIVE mg/dL
Nitrite: NEGATIVE
PH: 6 (ref 5.0–8.0)
Protein, ur: 30 mg/dL — AB
Specific Gravity, Urine: 1.024 (ref 1.005–1.030)
Urobilinogen, UA: 0.2 mg/dL (ref 0.0–1.0)

## 2014-04-03 MED ORDER — SODIUM CHLORIDE 0.9 % IV BOLUS (SEPSIS)
1000.0000 mL | Freq: Once | INTRAVENOUS | Status: AC
  Administered 2014-04-03: 1000 mL via INTRAVENOUS

## 2014-04-03 MED ORDER — VANCOMYCIN HCL IN DEXTROSE 1-5 GM/200ML-% IV SOLN
1000.0000 mg | Freq: Once | INTRAVENOUS | Status: DC
  Administered 2014-04-03: 1000 mg via INTRAVENOUS
  Filled 2014-04-03: qty 200

## 2014-04-03 MED ORDER — PREDNISONE 50 MG PO TABS
60.0000 mg | ORAL_TABLET | Freq: Once | ORAL | Status: AC
  Administered 2014-04-04: 60 mg via ORAL

## 2014-04-03 NOTE — ED Notes (Signed)
MD at bedside. 

## 2014-04-03 NOTE — H&P (Signed)
History and Physical       Hospital Admission Note Date: 04/03/2014  Patient name: [name] [name] Medical record number: [id number] Date of birth: 1956/01/31 Age: 58 y.o. Gender: male  PCP: Lorayne Marek, MD    Chief Complaint:  Perfuse diarrhea since yesterday  HPI: Patient is a 58 year old male with a history of hypertension, recent C. difficile colitis was discharged on 03/18/14 on 14 days of Flagyl. Patient reports that diarrhea had resolved and he has finished Flagyl. However since yesterday he again started having profuse diarrhea, 6-7 episodes yesterday, 4 episodes today at home and 2 in the ER. He denies any nausea or vomiting. He did have a low-grade fever at home and crampy abdominal pain in the left lower quadrant. He was recently on steroids for left foot gout attack, prescribed on 03/22/14 by Dr. Allyson Sabal at [facility].   Review of Systems:  Constitutional: Denies fever, chills, diaphoresis,+ poor appetite and fatigue.  HEENT: Denies photophobia, eye pain, redness, hearing loss, ear pain, congestion, sore throat, rhinorrhea, sneezing, mouth sores, trouble swallowing, neck pain, neck stiffness and tinnitus.   Respiratory: Denies SOB, DOE, cough, chest tightness,  and wheezing.   Cardiovascular: Denies chest pain, palpitations and leg swelling.  Gastrointestinal: please see history of present illness  Genitourinary: Denies dysuria, urgency, frequency, hematuria, flank pain and difficulty urinating.  Musculoskeletal: Denies myalgias, back pain,  arthralgias and gait problem.  patient also reports that his gout is flaring up in his left foot. Skin: Denies pallor, rash and wound.  Neurological: Denies dizziness, seizures, syncope, weakness, light-headedness, numbness and headaches.  Hematological: Denies adenopathy. Easy bruising, personal or family bleeding history  Psychiatric/Behavioral: Denies suicidal ideation,  mood changes, confusion, nervousness, sleep disturbance and agitation  Past Medical History: Past Medical History  Diagnosis Date  . Kidney stones   . Hypertension    Past Surgical History  Procedure Laterality Date  . Lithotripsy      Medications: Prior to Admission medications   Medication Sig Start Date End Date Taking? Authorizing Provider  ALPRAZolam Duanne Moron) 0.25 MG tablet Take 0.25 mg by mouth at bedtime as needed for sleep.   Yes Historical Provider, MD  busPIRone (BUSPAR) 15 MG tablet Take 15 mg by mouth daily.   Yes Historical Provider, MD  escitalopram (LEXAPRO) 20 MG tablet Take 20 mg by mouth daily.   Yes Historical Provider, MD  indomethacin (INDOCIN) 50 MG capsule Take 1 capsule (50 mg total) by mouth 3 (three) times daily as needed for mild pain or moderate pain (For 3 days for gout pain). 03/18/14  Yes Modena Jansky, MD  losartan (COZAAR) 100 MG tablet Take 100 mg by mouth daily.   Yes Historical Provider, MD  nystatin (MYCOSTATIN) 100000 UNIT/ML suspension Take 5 mLs (500,000 Units total) by mouth 4 (four) times daily. 03/22/14  Yes Reyne Dumas, MD  Tetrahydrozoline HCl (VISINE OP) Place 1 drop into both eyes daily.   Yes Historical Provider, MD    Allergies:  No Known Allergies  Social History:  reports that he quit smoking about 29 years ago. He does not have any smokeless tobacco history on file. He reports that he drinks alcohol. He reports that he does not use illicit drugs.  Family History: Family History  Problem Relation Age of Onset  . Cancer - Prostate Father     Physical Exam: Blood pressure 117/63, pulse 83, temperature 99.1 F (37.3 C), temperature source Oral, resp. rate 16, SpO2 94.00%. General: Alert, awake, oriented x3,  in no acute distress. HEENT: normocephalic, atraumatic, anicteric sclera, pink conjunctiva, pupils equal and reactive to light and accomodation, oropharynx clear Neck: supple, no masses or lymphadenopathy, no goiter, no  bruits  Heart: Regular rate and rhythm, without murmurs, rubs or gallops. Lungs: Clear to auscultation bilaterally, no wheezing, rales or rhonchi. Abdomen: Soft, mild tenderness in the left lower quadrant , nondistended, positive bowel sounds, no masses. Extremities: No clubbing, cyanosis or edema with positive pedal pulses. Neuro: Grossly intact, no focal neurological deficits, strength 5/5 upper and lower extremities bilaterally Psych: alert and oriented x 3, normal mood and affect Skin: no rashes or lesions, warm and dry   LABS on Admission:  Basic Metabolic Panel:  Recent Labs Lab 04/03/14 1917  NA 138  K 3.9  CL 100  CO2 24  GLUCOSE 141*  BUN 9  CREATININE 0.96  CALCIUM 9.1   Liver Function Tests:  Recent Labs Lab 04/03/14 1917  AST 17  ALT 23  ALKPHOS 90  BILITOT 0.6  PROT 6.9  ALBUMIN 3.2*   No results found for this basename: LIPASE, AMYLASE,  in the last 168 hours No results found for this basename: AMMONIA,  in the last 168 hours CBC:  Recent Labs Lab 04/03/14 1917  WBC 31.4*  NEUTROABS 27.6*  HGB 15.6  HCT 45.4  MCV 87.3  PLT 242   Cardiac Enzymes: No results found for this basename: CKTOTAL, CKMB, CKMBINDEX, TROPONINI,  in the last 168 hours BNP: No components found with this basename: POCBNP,  CBG: No results found for this basename: GLUCAP,  in the last 168 hours   Radiological Exams on Admission: Ct Abdomen Pelvis Wo Contrast  04/03/2014   CLINICAL DATA:  Hematuria.  Loss of appetite, weakness, diarrhea.  EXAM: CT ABDOMEN AND PELVIS WITHOUT CONTRAST  TECHNIQUE: Multidetector CT imaging of the abdomen and pelvis was performed following the standard protocol without intravenous contrast.  COMPARISON:  03/15/2014  FINDINGS: Linear densities in the lung bases are again noted, stable, likely areas of scarring. No confluent opacities or effusions. Heart is normal size.  Liver, gallbladder, spleen, pancreas, adrenals have an unremarkable unenhanced  appearance. 10 mm stone in the left renal pelvis. This is stable since prior study. Multiple nonobstructing stones in the left kidney. Single punctate nonobstructing stone in the lower pole of the right kidney. No hydronephrosis or ureteral stones.  Appendix is visualized and is normal.  Small bowel is decompressed.  There is sigmoid diverticulosis. Inflammatory stranding about the sigmoid colon is again noted compatible with active diverticulitis. The remainder of the colon wall appears normal and improved since prior study. Moderate stool burden throughout the colon.  Urinary bladder is decompressed. Small bilateral inguinal hernias containing fat. Aorta is normal caliber.  No acute bony abnormality.  IMPRESSION: Sigmoid diverticulosis with changes of active diverticulitis, similar to prior study. Remainder of the colon wall has improved and currently appears normal.  Moderate stool burden throughout the colon.  Bilateral nephrolithiasis. 10 mm left renal pelvic stone without hydronephrosis, stable.   Electronically Signed   By: Rolm Baptise M.D.   On: 04/03/2014 22:57   Ct Abdomen Pelvis Wo Contrast  03/06/2014   CLINICAL DATA:  History renal stones, back pain  EXAM: CT ABDOMEN AND PELVIS WITHOUT CONTRAST  TECHNIQUE: Multidetector CT imaging of the abdomen and pelvis was performed following the standard protocol without intravenous contrast.  COMPARISON:  None.  FINDINGS: Lung bases are clear.  No pericardial fluid.  Non IV contrast images  demonstrate no focal hepatic lesion. The gallbladder, pancreas, spleen, adrenal glands normal.  There is a large calculus in the left renal pelvis measuring 10 mm (image 55, series 2). There is no significant hydronephrosis on the left. There is a linear 17 mm calcification the cortex of the left kidney. There several additional punctate 1 mm calculi within the left renal collecting systems. There is no hydroureter hydroureter on the left. No ureteral stones on the left.  Right kidney is free of calculi. No right ureteral lithiasis. No bladder stones are present.  Stomach, small bowel, appendix, cecum normal. The colon and rectosigmoid colon are normal. Partial diverticulum sigmoid colon.  Abdominal or is normal caliber. No retroperitoneal periportal lymphadenopathy.  No free fluid the pelvis. The prostate gland and bladder normal. No pelvic lymphadenopathy. No aggressive osseous lesion.  IMPRESSION: 1. Large nonobstructing calculus in the left renal pelvis. 2. Additional multiple left nephrolithiasis. 3. While there is mild stranding along the proximal left ureter, there is no hydroureter or ureteral calculi present. 4. No bladder calculi. 5. No right nephrolithiasis ureterolithiasis. 6. Sigmoid diverticulosis without diverticulitis.   Electronically Signed   By: Suzy Bouchard M.D.   On: 03/06/2014 22:56   Dg Abd 1 View  03/14/2014   CLINICAL DATA:  Abdominal pain, distention and diarrhea  EXAM: ABDOMEN - 1 VIEW  COMPARISON:  CT abdomen / pelvis 03/06/2014  FINDINGS: Nonobstructive bowel gas pattern. Stable appearance of 9 mm stone in the expected location of the left renal pelvis. No acute osseous abnormality.  IMPRESSION: 1. Nonobstructed bowel gas pattern. 2. Similar appearance of 9 mm stone in the region of the left renal pelvis.   Electronically Signed   By: Jacqulynn Cadet M.D.   On: 03/14/2014 22:40   Ct Abdomen Pelvis W Contrast  03/15/2014   CLINICAL DATA:  Diarrhea, nausea, febrile for 3 days.  EXAM: CT ABDOMEN AND PELVIS WITH CONTRAST  TECHNIQUE: Multidetector CT imaging of the abdomen and pelvis was performed using the standard protocol following bolus administration of intravenous contrast.  CONTRAST:  168m OMNIPAQUE IOHEXOL 300 MG/ML  SOLN  COMPARISON:  DG ABD 1 VIEW dated 03/14/2014; CT ABD/PELV WO CM dated 03/06/2014  FINDINGS: Included view of the lung bases demonstrate right lower lobe atelectasis, slightly increased. . Visualized heart and pericardium  are unremarkable.  Diffuse colonic wall thickening and edema from the cecum to the rectum with inflammatory pericolonic changes about the descending colon/sigmoid colon. No pneumatosis. Scattered sigmoid diverticula. The stomach, small bowel are normal in course and caliber without inflammatory changes. Normal appendix. Trace free fluid in the pelvis without abscess or pneumoperitoneum.  Mildly diffusely hypodense liver most consistent with fatty infiltration, the liver is otherwise unremarkable. The spleen, pancreas, gallbladder and adrenal glands are unremarkable and unchanged.  10 mm of left renal pelvis nonobstructing calculus again seen, with multiple tiny left lower pole renal calculi in total measuring approximately 17 mm. No right nephrolithiasis. Two small to characterize hypodensities in left kidney. No hydronephrosis. No renal masses. Delayed imaging demonstrates prompt symmetric excretion of contrast into the proximal urinary collecting system. Urinary bladder is decompressed and unremarkable.  Great vessels are normal in course and caliber. No lymphadenopathy by CT size criteria. Internal reproductive organs are unremarkable. Small fat containing inguinal hernias. Moderate to severe degenerative change of the right sacroiliac joint.  IMPRESSION: Diffuse colitis from the cecum to the rectum with inflammatory changes about the sigmoid ; sigmoid diverticula, it is unclear whether this reflects superimposed diverticulitis.  No bowel perforation or bowel obstruction. Trace free fluid in the pelvis is likely reactive without abscess.  Multiple nonobstructing left nephrolithiasis.   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 Acute diverticulitis - Will obtain C. difficile PCR, for now place him on oral vancomycin 133m qid, and IV Flagyl. -  CT abdomen and pelvis showed sigmoid  diverticulosis with changes of active diverticulitis similar to prior study, remainder of colon wall has improved   Active Problems:   Dehydration Continue IV fluid hydration     Hypertension - Continue losartan     Leukocytosis -Possibly due to #1 and patient was recently on prednisone for acute gout attack  - Obtain blood cultures     Gout attack In the left foot - No colchicine due to diarrhea, continue indomethacin and prednisone  - Obtain uric acid, ESR, CRP   DVT prophylaxis:  SCDs   CODE STATUS:  full code   Family Communication: Admission, patients condition and plan of care including tests being ordered have been discussed with the patient and wife who indicates understanding and agree with the plan and Code Status   Further plan will depend as patient's clinical course evolves and further radiologic and laboratory data become available.   Time Spent on Admission: One-hour   Ripudeep KKrystal EatonM.D. [facility] 04/03/2014, 11:43 PM Pager: [phone] If 7PM-7AM, please contact night-coverage [email or web address] Password [email or web address]  **Disclaimer: This note was dictated with voice recognition software. Similar sounding words can inadvertently be transcribed and this note may contain transcription errors which may not have been corrected upon publication of note.**

## 2014-04-03 NOTE — ED Provider Notes (Addendum)
CSN: [id number]     Arrival date & time 04/03/14  1907 History   First MD Initiated Contact with Patient 04/03/14 2050     Chief Complaint  Patient presents with  . Diarrhea     (Consider location/radiation/quality/duration/timing/severity/associated sxs/prior Treatment) HPI 58 year old male who was recently discharged after admission for C. difficile colitis. He went home on Flagyl and completed the course. During the past day he has begun having loose stools up to 10 today. He has some tenderness with palpation on his abdomen. He reports a fever to 102 today. He describes some diffuse generalized weakness. He has not been taking solids in is that he have caused him to stool immediately but he is taking in some fluids. He describes his urine as dark in color. He has had some cough. He is on antihypertensives and took his medication as usual today. Past Medical History  Diagnosis Date  . Kidney stones   . Hypertension    Past Surgical History  Procedure Laterality Date  . Lithotripsy     Family History  Problem Relation Age of Onset  . Cancer - Prostate Father    History  Substance Use Topics  . Smoking status: Former Smoker    Quit date: 12/17/1984  . Smokeless tobacco: Not on file  . Alcohol Use: Yes     Comment: Socially    Review of Systems  Constitutional: Positive for fatigue.  HENT: Negative.   Eyes: Negative.   Respiratory: Positive for cough.   Cardiovascular: Negative.   Gastrointestinal: Positive for diarrhea.  Endocrine: Negative.   Genitourinary: Negative.   Allergic/Immunologic: Negative.   Neurological: Positive for weakness.  Hematological: Negative.   Psychiatric/Behavioral: Negative.   All other systems reviewed and are negative.     Allergies  Review of patient's allergies indicates no known allergies.  Home Medications   Prior to Admission medications   Medication Sig Start Date End Date Taking? Authorizing Provider  ALPRAZolam Prudy Feeler(XANAX)  0.25 MG tablet Take 0.25 mg by mouth at bedtime as needed for sleep.    Historical Provider, MD  busPIRone (BUSPAR) 15 MG tablet Take 15 mg by mouth daily.    Historical Provider, MD  cholecalciferol (VITAMIN D) 1000 UNITS tablet Take 1,000 Units by mouth daily.    Historical Provider, MD  escitalopram (LEXAPRO) 20 MG tablet Take 20 mg by mouth daily.    Historical Provider, MD  fluconazole (DIFLUCAN) 150 MG tablet Take 1 tablet (150 mg total) by mouth once. 03/22/14   Richarda OverlieNayana Abrol, MD  folic acid (FOLVITE) 400 MCG tablet Take 400 mcg by mouth daily.    Historical Provider, MD  indomethacin (INDOCIN) 50 MG capsule Take 1 capsule (50 mg total) by mouth 3 (three) times daily as needed for mild pain or moderate pain (For 3 days for gout pain). 03/18/14   Elease EtienneAnand D Hongalgi, MD  losartan (COZAAR) 100 MG tablet Take 100 mg by mouth daily.    Historical Provider, MD  MAGNESIUM PO Take 400 mg by mouth daily.    Historical Provider, MD  metroNIDAZOLE (FLAGYL) 500 MG tablet Take 1 tablet (500 mg total) by mouth 3 (three) times daily. 03/18/14   Elease EtienneAnand D Hongalgi, MD  nystatin (MYCOSTATIN) 100000 UNIT/ML suspension Take 5 mLs (500,000 Units total) by mouth 4 (four) times daily. 03/22/14   Richarda OverlieNayana Abrol, MD  OVER THE COUNTER MEDICATION Take 1 tablet by mouth daily. Zinc    Historical Provider, MD  potassium chloride SA (K-DUR,KLOR-CON) 20 MEQ tablet  Take 1 tablet (20 mEq total) by mouth daily. 03/22/14   Richarda OverlieNayana Abrol, MD  predniSONE (DELTASONE) 20 MG tablet Take 2 tablets (40 mg total) by mouth daily with breakfast. 03/22/14   Richarda OverlieNayana Abrol, MD  Tetrahydrozoline HCl (VISINE OP) Place 1 drop into both eyes daily.    Historical Provider, MD   BP 119/73  Pulse 89  Temp(Src) 99.1 F (37.3 C) (Oral)  Resp 16  SpO2 96% Physical Exam  Nursing note and vitals reviewed. Constitutional: He is oriented to person, place, and time. He appears well-developed and well-nourished.  Obese  HENT:  Head: Normocephalic and atraumatic.   Right Ear: External ear normal.  Left Ear: External ear normal.  Nose: Nose normal.  Mouth/Throat: Oropharynx is clear and moist.  Eyes: Conjunctivae and EOM are normal. Pupils are equal, round, and reactive to light.  Neck: Normal range of motion. Neck supple.  Cardiovascular: Normal rate, regular rhythm, normal heart sounds and intact distal pulses.   Pulmonary/Chest: Effort normal and breath sounds normal.  Abdominal: Soft. Bowel sounds are normal. There is tenderness.  Some tenderness to palpation in left lower quadrant  Musculoskeletal: Normal range of motion.  Neurological: He is alert and oriented to person, place, and time.  Skin: Skin is warm and dry.  Psychiatric: He has a normal mood and affect. His behavior is normal. Judgment and thought content normal.    ED Course  Procedures (including critical care time) Labs Review Labs Reviewed  CBC WITH DIFFERENTIAL - Abnormal; Notable for the following:    WBC 31.4 (*)    Neutrophils Relative % 88 (*)    Lymphocytes Relative 4 (*)    Neutro Abs 27.6 (*)    Monocytes Absolute 2.5 (*)    All other components within normal limits  COMPREHENSIVE METABOLIC PANEL - Abnormal; Notable for the following:    Glucose, Bld 141 (*)    Albumin 3.2 (*)    GFR calc non Af Amer 90 (*)    All other components within normal limits  URINALYSIS, ROUTINE W REFLEX MICROSCOPIC - Abnormal; Notable for the following:    Color, Urine AMBER (*)    APPearance CLOUDY (*)    Hgb urine dipstick LARGE (*)    Bilirubin Urine SMALL (*)    Protein, ur 30 (*)    Leukocytes, UA SMALL (*)    All other components within normal limits  STOOL CULTURE  CLOSTRIDIUM DIFFICILE BY PCR  URINE MICROSCOPIC-ADD ON    Imaging Review No results found.   EKG Interpretation None      MDM   Final diagnoses:  None  C. 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  difficile colitis. Admitted to the hospital and started empirically on oral Flagyl. He has progressively done well with improvement in stool consistency and decrease in stool frequency. He had 6 BMs yesterday but none since midnight and then one BM this morning which is loose but not watery as on admission. He denies abdominal pain. Appetite is improving. He was concerned regarding recurrence of C. difficile and was debating continuing Flagyl versus trying oral vancomycin from his research. Discussed with infectious disease M.D. on call who stated that it was reasonable to continue on Flagyl since he was getting better on it. However he could be switched to oral vancomycin if that's what patient desired. Patient however has finally decided to continue with Flagyl. He has been  counseled regarding avoiding unnecessary antibiotics, PPIs. He is also been counseled to use over-the-counter probiotic yogurts. He verbalizes understanding. Plan to complete total of 14 days of oral Flagyl. Stool cultures are negative to date. Above is from d/c summary of 03/18/14  Patient with known history of C. difficile colitis with return of symptoms, white blood cell count 31,000, patient is being hydrated with IV normal saline. He is having a CT without contrast to assess for any evidence of other acute abnormalities especially kidney stone and positive for nephritis as he has history of kidney stone and has too numerous to count red blood cells in urine. This will be cultured. He has recently been on Flagyl and will have vancomycin started here.    Hilario Quarry, MD 04/03/14 2113  Discussed with Dr. Isidoro Donning and plan admission to telemetry bed team 10.   Hilario Quarry, MD 04/03/14 [phone]

## 2014-04-03 NOTE — ED Notes (Signed)
Patient here with diarrhea.  Patient was admitted 3 weeks ago for Cdiff, took appropriate meds after discharge.  Patient now with diarrhea getting worse since yesterday.  Patient states he does not have any appetite and feels weak.

## 2014-04-04 ENCOUNTER — Encounter (HOSPITAL_COMMUNITY): Payer: Self-pay | Admitting: *Deleted

## 2014-04-04 DIAGNOSIS — M109 Gout, unspecified: Secondary | ICD-10-CM | POA: Diagnosis not present

## 2014-04-04 DIAGNOSIS — I10 Essential (primary) hypertension: Secondary | ICD-10-CM | POA: Diagnosis not present

## 2014-04-04 DIAGNOSIS — A0472 Enterocolitis due to Clostridium difficile, not specified as recurrent: Secondary | ICD-10-CM | POA: Diagnosis not present

## 2014-04-04 DIAGNOSIS — E876 Hypokalemia: Secondary | ICD-10-CM | POA: Diagnosis not present

## 2014-04-04 LAB — BASIC METABOLIC PANEL
BUN: 10 mg/dL (ref 6–23)
CALCIUM: 8.6 mg/dL (ref 8.4–10.5)
CO2: 21 mEq/L (ref 19–32)
Chloride: 107 mEq/L (ref 96–112)
Creatinine, Ser: 0.88 mg/dL (ref 0.50–1.35)
Glucose, Bld: 158 mg/dL — ABNORMAL HIGH (ref 70–99)
POTASSIUM: 3.5 meq/L — AB (ref 3.7–5.3)
Sodium: 142 mEq/L (ref 137–147)

## 2014-04-04 LAB — CBC
HCT: 43.1 % (ref 39.0–52.0)
Hemoglobin: 14.1 g/dL (ref 13.0–17.0)
MCH: 29 pg (ref 26.0–34.0)
MCHC: 32.7 g/dL (ref 30.0–36.0)
MCV: 88.5 fL (ref 78.0–100.0)
PLATELETS: 198 10*3/uL (ref 150–400)
RBC: 4.87 MIL/uL (ref 4.22–5.81)
RDW: 15.1 % (ref 11.5–15.5)
WBC: 21 10*3/uL — ABNORMAL HIGH (ref 4.0–10.5)

## 2014-04-04 LAB — CLOSTRIDIUM DIFFICILE BY PCR: Toxigenic C. Difficile by PCR: POSITIVE — AB

## 2014-04-04 LAB — C-REACTIVE PROTEIN: CRP: 18 mg/dL — AB (ref <0.60)

## 2014-04-04 LAB — SEDIMENTATION RATE: Sed Rate: 11 mm/hr (ref 0–16)

## 2014-04-04 LAB — URIC ACID: Uric Acid, Serum: 6.7 mg/dL (ref 4.0–7.8)

## 2014-04-04 MED ORDER — ALPRAZOLAM 0.25 MG PO TABS: 0.2500 mg | ORAL_TABLET | Freq: Every evening | ORAL | Status: DC | PRN

## 2014-04-04 MED ORDER — ONDANSETRON HCL 4 MG/2ML IJ SOLN: 4.0000 mg | Freq: Four times a day (QID) | INTRAMUSCULAR | Status: DC | PRN

## 2014-04-04 MED ORDER — ACETAMINOPHEN 650 MG RE SUPP
650.0000 mg | Freq: Four times a day (QID) | RECTAL | Status: DC | PRN
Start: 2014-04-04 — End: 2014-04-06

## 2014-04-04 MED ORDER — VANCOMYCIN 50 MG/ML ORAL SOLUTION
125.0000 mg | Freq: Four times a day (QID) | ORAL | Status: DC
  Administered 2014-04-04 – 2014-04-06 (×12): 125 mg via ORAL
  Filled 2014-04-04 (×14): qty 2.5

## 2014-04-04 MED ORDER — LOSARTAN POTASSIUM 50 MG PO TABS
100.0000 mg | ORAL_TABLET | Freq: Every day | ORAL | Status: DC
  Administered 2014-04-04 – 2014-04-06 (×3): 100 mg via ORAL
  Filled 2014-04-04 (×3): qty 2

## 2014-04-04 MED ORDER — SODIUM CHLORIDE 0.9 % IV SOLN
INTRAVENOUS | Status: DC
  Administered 2014-04-04 (×2): via INTRAVENOUS

## 2014-04-04 MED ORDER — ONDANSETRON HCL 4 MG PO TABS: 4.0000 mg | ORAL_TABLET | Freq: Four times a day (QID) | ORAL | Status: DC | PRN

## 2014-04-04 MED ORDER — PREDNISONE 20 MG PO TABS
40.0000 mg | ORAL_TABLET | Freq: Every day | ORAL | Status: DC
  Administered 2014-04-04: 40 mg via ORAL
  Filled 2014-04-04 (×3): qty 2

## 2014-04-04 MED ORDER — METRONIDAZOLE IN NACL 5-0.79 MG/ML-% IV SOLN
500.0000 mg | Freq: Three times a day (TID) | INTRAVENOUS | Status: DC
  Administered 2014-04-04 (×3): 500 mg via INTRAVENOUS
  Filled 2014-04-04 (×5): qty 100

## 2014-04-04 MED ORDER — ACETAMINOPHEN 325 MG PO TABS: 650.0000 mg | ORAL_TABLET | Freq: Four times a day (QID) | ORAL | Status: DC | PRN

## 2014-04-04 MED ORDER — POTASSIUM CHLORIDE CRYS ER 20 MEQ PO TBCR
40.0000 meq | EXTENDED_RELEASE_TABLET | Freq: Once | ORAL | Status: AC
  Administered 2014-04-04: 40 meq via ORAL
  Filled 2014-04-04: qty 2

## 2014-04-04 MED ORDER — INDOMETHACIN 50 MG PO CAPS
50.0000 mg | ORAL_CAPSULE | Freq: Three times a day (TID) | ORAL | Status: DC
  Administered 2014-04-04 (×3): 50 mg via ORAL
  Filled 2014-04-04 (×5): qty 1

## 2014-04-04 MED ORDER — ESCITALOPRAM OXALATE 20 MG PO TABS
20.0000 mg | ORAL_TABLET | Freq: Every day | ORAL | Status: DC
  Administered 2014-04-04 – 2014-04-06 (×3): 20 mg via ORAL
  Filled 2014-04-04 (×3): qty 1

## 2014-04-04 MED ORDER — INDOMETHACIN 50 MG PO CAPS
50.0000 mg | ORAL_CAPSULE | Freq: Three times a day (TID) | ORAL | Status: DC | PRN
  Administered 2014-04-06 (×2): 50 mg via ORAL
  Filled 2014-04-04 (×3): qty 1

## 2014-04-04 MED ORDER — NYSTATIN 100000 UNIT/ML MT SUSP
5.0000 mL | Freq: Four times a day (QID) | OROMUCOSAL | Status: DC
  Administered 2014-04-04 – 2014-04-06 (×10): 500000 [IU] via ORAL
  Filled 2014-04-04 (×12): qty 5

## 2014-04-04 MED ORDER — TETRAHYDROZOLINE HCL 0.05 % OP SOLN
1.0000 [drp] | Freq: Every day | OPHTHALMIC | Status: DC
  Administered 2014-04-04 – 2014-04-06 (×3): 1 [drp] via OPHTHALMIC
  Filled 2014-04-04: qty 15

## 2014-04-04 MED ORDER — HYDROCODONE-ACETAMINOPHEN 5-325 MG PO TABS
1.0000 | ORAL_TABLET | ORAL | Status: DC | PRN
  Filled 2014-04-04: qty 2

## 2014-04-04 MED ORDER — BUSPIRONE HCL 15 MG PO TABS
15.0000 mg | ORAL_TABLET | Freq: Every day | ORAL | Status: DC
  Administered 2014-04-04 – 2014-04-06 (×3): 15 mg via ORAL
  Filled 2014-04-04 (×3): qty 1

## 2014-04-04 MED ORDER — HYDROMORPHONE HCL PF 1 MG/ML IJ SOLN: 1.0000 mg | INTRAMUSCULAR | Status: DC | PRN

## 2014-04-04 NOTE — Progress Notes (Signed)
Chart reviewed.   [facility] PROGRESS NOTE  [name] CourseJohn [name] DOB: 03-02-1956 DOA: 04/03/2014 PCP: [name], [name], MD  Assessment/Plan:  Principal Problem:     C. difficile colitis relapse. Continue oral vancomycin. D/c iv flagyl.  Stools slowing. Saline lock. Home 1-2 days if stable Active Problems:   Dehydration   Hypertension    Leukocytosis secondary to above v. steroids   Gout attack: resolved. D/c steroids. Change indocin to prn  HPI/Subjective: Stool becoming more formed, less frequent. No f/c/N/V. Wants to go home. No gout pain  Objective: Filed Vitals:   04/04/14 1346  BP: 131/59  Pulse: 57  Temp: 97.4 F (36.3 C)  Resp: 16    Intake/Output Summary (Last 24 hours) at 04/04/14 1518 Last data filed at 04/04/14 0900  Gross per 24 hour  Intake    600 ml  Output    400 ml  Net    200 ml   Filed Weights   04/04/14 0040  Weight: 98.431 kg (217 lb)    Exam:   General:  nontoxic  Cardiovascular: RRR without MGR  Respiratory: CTA without WRR  Abdomen: S, NT, ND  Ext: no CCE. No joint tenderness, erythema, warmth  Basic Metabolic Panel:  Recent Labs Lab 04/03/14 1917 04/04/14 0740  NA 138 142  K 3.9 3.5*  CL 100 107  CO2 24 21  GLUCOSE 141* 158*  BUN 9 10  CREATININE 0.96 0.88  CALCIUM 9.1 8.6   Liver Function Tests:  Recent Labs Lab 04/03/14 1917  AST 17  ALT 23  ALKPHOS 90  BILITOT 0.6  PROT 6.9  ALBUMIN 3.2*   No results found for this basename: LIPASE, AMYLASE,  in the last 168 hours No results found for this basename: AMMONIA,  in the last 168 hours CBC:  Recent Labs Lab 04/03/14 1917 04/04/14 0740  WBC 31.4* 21.0*  NEUTROABS 27.6*  --   HGB 15.6 14.1  HCT 45.4 43.1  MCV 87.3 88.5  PLT 242 198   Cardiac Enzymes: No results found for this basename: CKTOTAL, CKMB, CKMBINDEX, TROPONINI,  in the last 168 hours BNP (last 3 results) No results found for this basename: PROBNP,  in the last 8760  hours CBG: No results found for this basename: GLUCAP,  in the last 168 hours  Recent Results (from the past 240 hour(s))  CLOSTRIDIUM DIFFICILE BY PCR     Status: Abnormal   Collection Time    04/03/14  8:38 PM      Result Value Ref Range Status   C difficile by pcr POSITIVE (*) NEGATIVE Final   Comment: CRITICAL RESULT CALLED TO, READ BACK BY AND VERIFIED WITH:     THOMPSON,T RN 04/03/14 1315 WOOTEN,K     Studies: Ct Abdomen Pelvis Wo Contrast  04/03/2014   CLINICAL DATA:  Hematuria.  Loss of appetite, weakness, diarrhea.  EXAM: CT ABDOMEN AND PELVIS WITHOUT CONTRAST  TECHNIQUE: Multidetector CT imaging of the abdomen and pelvis was performed following the standard protocol without intravenous contrast.  COMPARISON:  03/15/2014  FINDINGS: Linear densities in the lung bases are again noted, stable, likely areas of scarring. No confluent opacities or effusions. Heart is normal size.  Liver, gallbladder, spleen, pancreas, adrenals have an unremarkable unenhanced appearance. 10 mm stone in the left renal pelvis. This is stable since prior study. Multiple nonobstructing stones in the left kidney. Single punctate nonobstructing stone in the lower pole of the right kidney. No hydronephrosis or ureteral stones.  Appendix is  visualized and is normal.  Small bowel is decompressed.  There is sigmoid diverticulosis. Inflammatory stranding about the sigmoid colon is again noted compatible with active diverticulitis. The remainder of the colon wall appears normal and improved since prior study. Moderate stool burden throughout the colon.  Urinary bladder is decompressed. Small bilateral inguinal hernias containing fat. Aorta is normal caliber.  No acute bony abnormality.  IMPRESSION: Sigmoid diverticulosis with changes of active diverticulitis, similar to prior study. Remainder of the colon wall has improved and currently appears normal.  Moderate stool burden throughout the colon.  Bilateral nephrolithiasis. 10  mm left renal pelvic stone without hydronephrosis, stable.   Electronically Signed   By: Charlett NoseKevin  Dover M.D.   On: 04/03/2014 22:57    Scheduled Meds: . busPIRone  15 mg Oral Daily  . escitalopram  20 mg Oral Daily  . indomethacin  50 mg Oral TID WC  . losartan  100 mg Oral Daily  . metronidazole  500 mg Intravenous 3 times per day  . nystatin  5 mL Oral QID  . predniSONE  40 mg Oral Q breakfast  . tetrahydrozoline  1 drop Both Eyes Daily  . vancomycin  125 mg Oral QID   Continuous Infusions: . sodium chloride 75 mL/hr at 04/04/14 1403    Time spent: 35 minutes  [name]  [facility] Pager [phone]. If 7PM-7AM, please contact night-coverage at [email or web address], password [email or web address] 04/04/2014, 3:18 PM  LOS: 1 day

## 2014-04-04 NOTE — Progress Notes (Signed)
CRITICAL VALUE ALERT  Critical value received:  C.diff positive  Date of notification: 04/04/2014   Time of notification:  1319  Critical value read back:yes  Nurse who received alert:  Dorice Lamasaylor Thompson  MD notified (1st page):  Lendell CapriceSullivan  Time of first page:  1325  MD notified (2nd page): Lendell CapriceSullivan  Time of second page:1411  Responding MD:  Lendell CapriceSullivan  Time MD responded:  1415

## 2014-04-05 DIAGNOSIS — E876 Hypokalemia: Secondary | ICD-10-CM | POA: Diagnosis not present

## 2014-04-05 LAB — CBC WITH DIFFERENTIAL/PLATELET
Basophils Absolute: 0 10*3/uL (ref 0.0–0.1)
Basophils Relative: 0 % (ref 0–1)
Eosinophils Absolute: 0 10*3/uL (ref 0.0–0.7)
Eosinophils Relative: 0 % (ref 0–5)
HCT: 40.8 % (ref 39.0–52.0)
Hemoglobin: 14 g/dL (ref 13.0–17.0)
LYMPHS ABS: 1.6 10*3/uL (ref 0.7–4.0)
LYMPHS PCT: 7 % — AB (ref 12–46)
MCH: 29.7 pg (ref 26.0–34.0)
MCHC: 34.3 g/dL (ref 30.0–36.0)
MCV: 86.6 fL (ref 78.0–100.0)
Monocytes Absolute: 1.7 10*3/uL — ABNORMAL HIGH (ref 0.1–1.0)
Monocytes Relative: 7 % (ref 3–12)
NEUTROS ABS: 19.6 10*3/uL — AB (ref 1.7–7.7)
NEUTROS PCT: 86 % — AB (ref 43–77)
PLATELETS: 212 10*3/uL (ref 150–400)
RBC: 4.71 MIL/uL (ref 4.22–5.81)
RDW: 15 % (ref 11.5–15.5)
WBC: 22.9 10*3/uL — AB (ref 4.0–10.5)

## 2014-04-05 LAB — BASIC METABOLIC PANEL
BUN: 12 mg/dL (ref 6–23)
CHLORIDE: 108 meq/L (ref 96–112)
CO2: 19 meq/L (ref 19–32)
Calcium: 8.4 mg/dL (ref 8.4–10.5)
Creatinine, Ser: 0.74 mg/dL (ref 0.50–1.35)
GFR calc Af Amer: >90 mL/min (ref >90)
GFR calc non Af Amer: >90 mL/min (ref >90)
GLUCOSE: 133 mg/dL — AB (ref 70–99)
Potassium: 3.5 mEq/L — ABNORMAL LOW (ref 3.7–5.3)
SODIUM: 142 meq/L (ref 137–147)

## 2014-04-05 LAB — MAGNESIUM: Magnesium: 2 mg/dL (ref 1.5–2.5)

## 2014-04-05 MED ORDER — POTASSIUM CHLORIDE CRYS ER 20 MEQ PO TBCR
40.0000 meq | EXTENDED_RELEASE_TABLET | Freq: Once | ORAL | Status: AC
  Administered 2014-04-05: 40 meq via ORAL
  Filled 2014-04-05: qty 2

## 2014-04-05 NOTE — Progress Notes (Signed)
[facility] PROGRESS NOTE  [name] [name] [id number] DOB: 1955-12-24 DOA: 04/03/2014 PCP: [name], [name], MD  Assessment/Plan:  Principal Problem:     C. difficile colitis relapse. Continue oral vancomycin. Stool frequency and volume picking back up. May need to resume IVF. Not stable for discharge.  Active Problems:   Hypertension   Leukocytosis secondary to above v. steroids   Gout attack: resolved.   HPI/Subjective: C/o more frequent, more voluminous stool. No nausea or vomiting. Took a shower this morning. Feeling a bit stronger. No fevers or chills.  Objective: Filed Vitals:   04/05/14 0549  BP: 128/63  Pulse: 58  Temp: 97.7 F (36.5 C)  Resp: 16   No intake or output data in the 24 hours ending 04/05/14 1136 Filed Weights   04/04/14 0040  Weight: 98.431 kg (217 lb)    Exam:   General:  nontoxic  Cardiovascular: RRR without MGR  Respiratory: CTA without WRR  Abdomen: S, NT, ND  Ext: no CCE. No joint tenderness, erythema, warmth  Basic Metabolic Panel:  Recent Labs Lab 04/03/14 1917 04/04/14 0740 04/05/14 0400  NA 138 142 142  K 3.9 3.5* 3.5*  CL 100 107 108  CO2 24 21 19   GLUCOSE 141* 158* 133*  BUN 9 10 12   CREATININE 0.96 0.88 0.74  CALCIUM 9.1 8.6 8.4  MG  --   --  2.0   Liver Function Tests:  Recent Labs Lab 04/03/14 1917  AST 17  ALT 23  ALKPHOS 90  BILITOT 0.6  PROT 6.9  ALBUMIN 3.2*   No results found for this basename: LIPASE, AMYLASE,  in the last 168 hours No results found for this basename: AMMONIA,  in the last 168 hours CBC:  Recent Labs Lab 04/03/14 1917 04/04/14 0740 04/05/14 0400  WBC 31.4* 21.0* 22.9*  NEUTROABS 27.6*  --  19.6*  HGB 15.6 14.1 14.0  HCT 45.4 43.1 40.8  MCV 87.3 88.5 86.6  PLT 242 198 212   Cardiac Enzymes: No results found for this basename: CKTOTAL, CKMB, CKMBINDEX, TROPONINI,  in the last 168 hours BNP (last 3 results) No results found for this basename: PROBNP,  in the last  8760 hours CBG: No results found for this basename: GLUCAP,  in the last 168 hours  Recent Results (from the past 240 hour(s))  CLOSTRIDIUM DIFFICILE BY PCR     Status: Abnormal   Collection Time    04/03/14  8:38 PM      Result Value Ref Range Status   C difficile by pcr POSITIVE (*) NEGATIVE Final   Comment: CRITICAL RESULT CALLED TO, READ BACK BY AND VERIFIED WITH:     THOMPSON,T RN 04/03/14 1315 WOOTEN,K  CULTURE, BLOOD (ROUTINE X 2)     Status: None   Collection Time    04/04/14 12:08 AM      Result Value Ref Range Status   Specimen Description BLOOD RIGHT ARM   Final   Special Requests BOTTLES DRAWN AEROBIC AND ANAEROBIC 10CC EA   Final   Culture  Setup Time     Final   Value: 04/04/2014 02:24     [facility]formed [facility]   Culture     Final   Value:        BLOOD CULTURE RECEIVED NO GROWTH TO DATE CULTURE WILL BE HELD FOR 5 DAYS BEFORE ISSUING A FINAL NEGATIVE REPORT     Performed at [facility]   Report Status PENDING   Incomplete  CULTURE, BLOOD (ROUTINE X 2)     Status: None   Collection Time    04/04/14 12:20 AM      Result Value Ref Range Status   Specimen Description BLOOD RIGHT HAND   Final   Special Requests BOTTLES DRAWN AEROBIC ONLY [facility]   Final   Culture  Setup Time     Final   Value: 04/04/2014 02:24     Performed at [facility]   Culture     Final   Value:        BLOOD CULTURE RECEIVED NO GROWTH TO DATE CULTURE WILL BE HELD FOR 5 DAYS BEFORE ISSUING A FINAL NEGATIVE REPORT     Performed at [facility]   Report Status PENDING   Incomplete     Studies: Ct Abdomen Pelvis Wo Contrast  04/03/2014   CLINICAL DATA:  Hematuria.  Loss of appetite, weakness, diarrhea.  EXAM: CT ABDOMEN AND PELVIS WITHOUT CONTRAST  TECHNIQUE: Multidetector CT imaging of the abdomen and pelvis was performed following the standard protocol without intravenous contrast.  COMPARISON:  03/15/2014  FINDINGS: Linear densities in the lung bases are again  noted, stable, likely areas of scarring. No confluent opacities or effusions. Heart is normal size.  Liver, gallbladder, spleen, pancreas, adrenals have an unremarkable unenhanced appearance. 10 mm stone in the left renal pelvis. This is stable since prior study. Multiple nonobstructing stones in the left kidney. Single punctate nonobstructing stone in the lower pole of the right kidney. No hydronephrosis or ureteral stones.  Appendix is visualized and is normal.  Small bowel is decompressed.  There is sigmoid diverticulosis. Inflammatory stranding about the sigmoid colon is again noted compatible with active diverticulitis. The remainder of the colon wall appears normal and improved since prior study. Moderate stool burden throughout the colon.  Urinary bladder is decompressed. Small bilateral inguinal hernias containing fat. Aorta is normal caliber.  No acute bony abnormality.  IMPRESSION: Sigmoid diverticulosis with changes of active diverticulitis, similar to prior study. Remainder of the colon wall has improved and currently appears normal.  Moderate stool burden throughout the colon.  Bilateral nephrolithiasis. 10 mm left renal pelvic stone without hydronephrosis, stable.   Electronically Signed   By: Charlett NoseKevin  Dover M.D.   On: 04/03/2014 22:57    Scheduled Meds: . busPIRone  15 mg Oral Daily  . escitalopram  20 mg Oral Daily  . losartan  100 mg Oral Daily  . nystatin  5 mL Oral QID  . tetrahydrozoline  1 drop Both Eyes Daily  . vancomycin  125 mg Oral QID   Continuous Infusions:   Time spent: 25 minutes  Christiane Haorinna L Exander Shaul, MD  [facility] Pager [phone]. If 7PM-7AM, please contact night-coverage at [email or web address], password [email or web address] 04/05/2014, 11:36 AM  LOS: 2 days

## 2014-04-06 DIAGNOSIS — A0472 Enterocolitis due to Clostridium difficile, not specified as recurrent: Secondary | ICD-10-CM | POA: Diagnosis not present

## 2014-04-06 DIAGNOSIS — I10 Essential (primary) hypertension: Secondary | ICD-10-CM

## 2014-04-06 DIAGNOSIS — E876 Hypokalemia: Secondary | ICD-10-CM

## 2014-04-06 DIAGNOSIS — M109 Gout, unspecified: Secondary | ICD-10-CM | POA: Diagnosis not present

## 2014-04-06 LAB — BASIC METABOLIC PANEL
BUN: 10 mg/dL (ref 6–23)
CO2: 24 meq/L (ref 19–32)
CREATININE: 0.8 mg/dL (ref 0.50–1.35)
Calcium: 8.5 mg/dL (ref 8.4–10.5)
Chloride: 109 mEq/L (ref 96–112)
GFR calc Af Amer: >90 mL/min (ref >90)
GFR calc non Af Amer: >90 mL/min (ref >90)
Glucose, Bld: 114 mg/dL — ABNORMAL HIGH (ref 70–99)
Potassium: 3.8 mEq/L (ref 3.7–5.3)
Sodium: 146 mEq/L (ref 137–147)

## 2014-04-06 LAB — CBC
HCT: 41.2 % (ref 39.0–52.0)
Hemoglobin: 13.7 g/dL (ref 13.0–17.0)
MCH: 29.2 pg (ref 26.0–34.0)
MCHC: 33.3 g/dL (ref 30.0–36.0)
MCV: 87.8 fL (ref 78.0–100.0)
Platelets: 226 10*3/uL (ref 150–400)
RBC: 4.69 MIL/uL (ref 4.22–5.81)
RDW: 15.5 % (ref 11.5–15.5)
WBC: 9 10*3/uL (ref 4.0–10.5)

## 2014-04-06 MED ORDER — VANCOMYCIN 50 MG/ML ORAL SOLUTION: 125.0000 mg | Freq: Four times a day (QID) | ORAL | Status: DC

## 2014-04-06 NOTE — Progress Notes (Addendum)
[facility]  SURGICAL [address] [id number] [place] [id number] Phone: [phone]  April 06, 2014  Patient: [name] [name]  Date of Birth: April 08, 1956  Date of Visit: 04/03/2014    To Whom It May Concern:  [name] CourseJohn Eisenhower was seen and treated in our hospital on 04/03/2014. [name] CourseJohn Reznik  may return to work on through 04/25/14.  Please note that this hospitalization is due to a relapse of original clostridium difficile infection diagnosed on 03/14/2014  Sincerely,     Crista Curborinna Olyver Hawes, M.D.

## 2014-04-06 NOTE — Discharge Summary (Signed)
Physician Discharge Summary  [name] [name] ZSW:[id number] DOB: 1956/08/05 DOA: 04/03/2014  PCP: Doris Cheadle, MD  Admit date: 04/03/2014 Discharge date: 04/06/2014  Time spent: greater than 30 minutes  Discharge Diagnoses:  Principal Problem:   C. difficile colitis relapse Active Problems:   Dehydration   Hypertension   Leukocytosis   Gout attack  Discharge Condition: stable  Filed Weights   04/04/14 0040  Weight: 98.431 kg (217 lb)    History of present illness:  58 year old male with a history of hypertension, recent C. difficile colitis was discharged on 03/18/14 on 14 days of Flagyl. Patient reports that diarrhea had resolved and he has finished Flagyl. However since yesterday he again started having profuse diarrhea, 6-7 episodes yesterday, 4 episodes today at home and 2 in the ER. He denies any nausea or vomiting. He did have a low-grade fever at home and crampy abdominal pain in the left lower quadrant.  He was recently on steroids for left foot gout attack, prescribed on 03/22/14 by Dr. Susie Cassette at [facility] Course:  Admitted to medsurg. Started on oral vancomycin, IVF, supportive care.  Steroids and indocin stopped.  Gout attack had resolved.  By discharge, stools becoming less frequent, more formed. Abdominal cramping has improved. Tolerating solid diet.  Note for work completed.  Patient wishes to find a new PCP in the area.  Procedures:  none  Consultations:  none  Discharge Exam: Filed Vitals:   04/06/14 1402  BP: 140/71  Pulse: 60  Temp: 97.4 F (36.3 C)  Resp: 18    General: nontoxic Cardiovascular: RRR Respiratory: CTA abd S, nt, nd Ext no CCE  Discharge Orders   Future Orders Complete By Expires   Activity as tolerated - No restrictions  As directed    Diet - low sodium heart healthy  As directed        Medication List    STOP taking these medications       nystatin 100000 UNIT/ML suspension  Commonly known as:   MYCOSTATIN      TAKE these medications       ALPRAZolam 0.25 MG tablet  Commonly known as:  XANAX  Take 0.25 mg by mouth at bedtime as needed for sleep.     busPIRone 15 MG tablet  Commonly known as:  BUSPAR  Take 15 mg by mouth daily.     escitalopram 20 MG tablet  Commonly known as:  LEXAPRO  Take 20 mg by mouth daily.     indomethacin 50 MG capsule  Commonly known as:  INDOCIN  Take 1 capsule (50 mg total) by mouth 3 (three) times daily as needed for mild pain or moderate pain (For 3 days for gout pain).     losartan 100 MG tablet  Commonly known as:  COZAAR  Take 100 mg by mouth daily.     vancomycin 50 mg/mL oral solution  Commonly known as:  VANCOCIN  Take 2.5 mLs (125 mg total) by mouth 4 (four) times daily. Through 04/17/14. Compound to a suspension. Quantity: QS     VISINE OP  Place 1 drop into both eyes daily.       No Known Allergies     Follow-up Information   Follow up with [facility], MD In 2 weeks. (or primary care provider of choice)    Specialty:  Family Medicine   Contact information:   [address] [place] [address] [phone]  The results of significant diagnostics from this hospitalization (including imaging, microbiology, ancillary and laboratory) are listed below for reference.    Significant Diagnostic Studies: Ct Abdomen Pelvis Wo Contrast  04/03/2014   CLINICAL DATA:  Hematuria.  Loss of appetite, weakness, diarrhea.  EXAM: CT ABDOMEN AND PELVIS WITHOUT CONTRAST  TECHNIQUE: Multidetector CT imaging of the abdomen and pelvis was performed following the standard protocol without intravenous contrast.  COMPARISON:  03/15/2014  FINDINGS: Linear densities in the lung bases are again noted, stable, likely areas of scarring. No confluent opacities or effusions. Heart is normal size.  Liver, gallbladder, spleen, pancreas, adrenals have an unremarkable unenhanced appearance. 10 mm stone in the left renal pelvis. This is  stable since prior study. Multiple nonobstructing stones in the left kidney. Single punctate nonobstructing stone in the lower pole of the right kidney. No hydronephrosis or ureteral stones.  Appendix is visualized and is normal.  Small bowel is decompressed.  There is sigmoid diverticulosis. Inflammatory stranding about the sigmoid colon is again noted compatible with active diverticulitis. The remainder of the colon wall appears normal and improved since prior study. Moderate stool burden throughout the colon.  Urinary bladder is decompressed. Small bilateral inguinal hernias containing fat. Aorta is normal caliber.  No acute bony abnormality.  IMPRESSION: Sigmoid diverticulosis with changes of active diverticulitis, similar to prior study. Remainder of the colon wall has improved and currently appears normal.  Moderate stool burden throughout the colon.  Bilateral nephrolithiasis. 10 mm left renal pelvic stone without hydronephrosis, stable.   Electronically Signed   By: Charlett NoseKevin  Dover M.D.   On: 04/03/2014 22:57   Dg Abd 1 View  03/14/2014   CLINICAL DATA:  Abdominal pain, distention and diarrhea  EXAM: ABDOMEN - 1 VIEW  COMPARISON:  CT abdomen / pelvis 03/06/2014  FINDINGS: Nonobstructive bowel gas pattern. Stable appearance of 9 mm stone in the expected location of the left renal pelvis. No acute osseous abnormality.  IMPRESSION: 1. Nonobstructed bowel gas pattern. 2. Similar appearance of 9 mm stone in the region of the left renal pelvis.   Electronically Signed   By: Malachy MoanHeath  McCullough M.D.   On: 03/14/2014 22:40   Ct Abdomen Pelvis W Contrast  03/15/2014   CLINICAL DATA:  Diarrhea, nausea, febrile for 3 days.  EXAM: CT ABDOMEN AND PELVIS WITH CONTRAST  TECHNIQUE: Multidetector CT imaging of the abdomen and pelvis was performed using the standard protocol following bolus administration of intravenous contrast.  CONTRAST:  100mL OMNIPAQUE IOHEXOL 300 MG/ML  SOLN  COMPARISON:  DG ABD 1 VIEW dated 03/14/2014;  CT ABD/PELV WO CM dated 03/06/2014  FINDINGS: Included view of the lung bases demonstrate right lower lobe atelectasis, slightly increased. . Visualized heart and pericardium are unremarkable.  Diffuse colonic wall thickening and edema from the cecum to the rectum with inflammatory pericolonic changes about the descending colon/sigmoid colon. No pneumatosis. Scattered sigmoid diverticula. The stomach, small bowel are normal in course and caliber without inflammatory changes. Normal appendix. Trace free fluid in the pelvis without abscess or pneumoperitoneum.  Mildly diffusely hypodense liver most consistent with fatty infiltration, the liver is otherwise unremarkable. The spleen, pancreas, gallbladder and adrenal glands are unremarkable and unchanged.  10 mm of left renal pelvis nonobstructing calculus again seen, with multiple tiny left lower pole renal calculi in total measuring approximately 17 mm. No right nephrolithiasis. Two small to characterize hypodensities in left kidney. No hydronephrosis. No renal masses. Delayed imaging demonstrates prompt symmetric excretion of contrast into the  proximal urinary collecting system. Urinary bladder is decompressed and unremarkable.  Great vessels are normal in course and caliber. No lymphadenopathy by CT size criteria. Internal reproductive organs are unremarkable. Small fat containing inguinal hernias. Moderate to severe degenerative change of the right sacroiliac joint.  IMPRESSION: Diffuse colitis from the cecum to the rectum with inflammatory changes about the sigmoid ; sigmoid diverticula, it is unclear whether this reflects superimposed diverticulitis. No bowel perforation or bowel obstruction. Trace free fluid in the pelvis is likely reactive without abscess.  Multiple nonobstructing left nephrolithiasis.   Electronically Signed   By: Awilda Metroourtnay  Bloomer   On: 03/15/2014 01:55    Microbiology: Recent Results (from the past 240 hour(s))  STOOL CULTURE     Status:  None   Collection Time    04/03/14  8:38 PM      Result Value Ref Range Status   Specimen Description STOOL   Final   Special Requests NONE   Final   Culture     Final   Value: Culture reincubated for better growth     [facility]formed at [facility]   Report Status PENDING   Incomplete  CLOSTRIDIUM DIFFICILE BY PCR     Status: Abnormal   Collection Time    04/03/14  8:38 PM      Result Value Ref Range Status   C difficile by pcr POSITIVE (*) NEGATIVE Final   Comment: CRITICAL RESULT CALLED TO, READ BACK BY AND VERIFIED WITH:     THOMPSON,T RN 04/03/14 1315 WOOTEN,K  CULTURE, BLOOD (ROUTINE X 2)     Status: None   Collection Time    04/04/14 12:08 AM      Result Value Ref Range Status   Specimen Description BLOOD RIGHT ARM   Final   Special Requests BOTTLES DRAWN AEROBIC AND ANAEROBIC 10CC EA   Final   Culture  Setup Time     Final   Value: 04/04/2014 02:24     Performed at [facility]   Culture     Final   Value:        BLOOD CULTURE RECEIVED NO GROWTH TO DATE CULTURE WILL BE HELD FOR 5 DAYS BEFORE ISSUING A FINAL NEGATIVE REPORT     Performed at [facility]   Report Status PENDING   Incomplete  CULTURE, BLOOD (ROUTINE X 2)     Status: None   Collection Time    04/04/14 12:20 AM      Result Value Ref Range Status   Specimen Description BLOOD RIGHT HAND   Final   Special Requests BOTTLES DRAWN AEROBIC ONLY 7CC   Final   Culture  Setup Time     Final   Value: 04/04/2014 02:24     Performed at [facility]   Culture     Final   Value:        BLOOD CULTURE RECEIVED NO GROWTH TO DATE CULTURE WILL BE HELD FOR 5 DAYS BEFORE ISSUING A FINAL NEGATIVE REPORT     Performed at [facility]   Report Status PENDING   Incomplete     Labs: Basic Metabolic Panel:  Recent Labs Lab 04/03/14 1917 04/04/14 0740 04/05/14 0400 04/06/14 0441  NA 138 142 142 146  K 3.9 3.5* 3.5* 3.8  CL 100 107 108 109  CO2 24 21 19 24   GLUCOSE 141* 158* 133* 114*   BUN 9 10 12 10   CREATININE 0.96 0.88 0.74 0.80  CALCIUM 9.1  8.6 8.4 8.5  MG  --   --  2.0  --    Liver Function Tests:  Recent Labs Lab 04/03/14 1917  AST 17  ALT 23  ALKPHOS 90  BILITOT 0.6  PROT 6.9  ALBUMIN 3.2*   No results found for this basename: LIPASE, AMYLASE,  in the last 168 hours No results found for this basename: AMMONIA,  in the last 168 hours CBC:  Recent Labs Lab 04/03/14 1917 04/04/14 0740 04/05/14 0400 04/06/14 0441  WBC 31.4* 21.0* 22.9* 9.0  NEUTROABS 27.6*  --  19.6*  --   HGB 15.6 14.1 14.0 13.7  HCT 45.4 43.1 40.8 41.2  MCV 87.3 88.5 86.6 87.8  PLT 242 198 212 226   Cardiac Enzymes: No results found for this basename: CKTOTAL, CKMB, CKMBINDEX, TROPONINI,  in the last 168 hours BNP: BNP (last 3 results) No results found for this basename: PROBNP,  in the last 8760 hours CBG: No results found for this basename: GLUCAP,  in the last 168 hours     Signed:  Christiane Ha  [facility] 04/06/2014, 3:30 PM

## 2014-04-06 NOTE — Progress Notes (Signed)
Discharge instructions reviewed with patient and wife, questions answered, verbalized understanding.   Written prescription for Vancomycin given as well as oral dose due at 1800 per MD okay. Patient given note for work as well per MD.  Patient ambulatory to main entrance of hospital to be taken home by wife.  Patient in good condition at time of discharge from [facility].

## 2014-04-07 LAB — STOOL CULTURE

## 2014-04-10 LAB — CULTURE, BLOOD (ROUTINE X 2)
Culture: NO GROWTH
Culture: NO GROWTH

## 2014-04-20 ENCOUNTER — Encounter (HOSPITAL_COMMUNITY): Payer: Self-pay | Admitting: Emergency Medicine

## 2014-04-20 ENCOUNTER — Emergency Department (HOSPITAL_COMMUNITY): Payer: Worker's Compensation

## 2014-04-20 ENCOUNTER — Emergency Department (HOSPITAL_COMMUNITY)
Admission: EM | Admit: 2014-04-20 | Discharge: 2014-04-20 | Disposition: A | Discharge status: Home / Self Care | Payer: Worker's Compensation | Attending: Emergency Medicine | Admitting: Emergency Medicine

## 2014-04-20 DIAGNOSIS — I10 Essential (primary) hypertension: Secondary | ICD-10-CM | POA: Diagnosis not present

## 2014-04-20 DIAGNOSIS — M6289 Other specified disorders of muscle: Secondary | ICD-10-CM | POA: Insufficient documentation

## 2014-04-20 DIAGNOSIS — Z87442 Personal history of urinary calculi: Secondary | ICD-10-CM | POA: Diagnosis not present

## 2014-04-20 DIAGNOSIS — Z8619 Personal history of other infectious and parasitic diseases: Secondary | ICD-10-CM | POA: Insufficient documentation

## 2014-04-20 DIAGNOSIS — R319 Hematuria, unspecified: Secondary | ICD-10-CM | POA: Insufficient documentation

## 2014-04-20 DIAGNOSIS — Z79899 Other long term (current) drug therapy: Secondary | ICD-10-CM | POA: Diagnosis not present

## 2014-04-20 DIAGNOSIS — Z792 Long term (current) use of antibiotics: Secondary | ICD-10-CM | POA: Diagnosis not present

## 2014-04-20 DIAGNOSIS — Z87891 Personal history of nicotine dependence: Secondary | ICD-10-CM | POA: Insufficient documentation

## 2014-04-20 LAB — CBC WITH DIFFERENTIAL/PLATELET
BASOS PCT: 1 % (ref 0–1)
Basophils Absolute: 0.1 10*3/uL (ref 0.0–0.1)
EOS PCT: 2 % (ref 0–5)
Eosinophils Absolute: 0.1 10*3/uL (ref 0.0–0.7)
HCT: 38.7 % — ABNORMAL LOW (ref 39.0–52.0)
Hemoglobin: 13.3 g/dL (ref 13.0–17.0)
Lymphocytes Relative: 23 % (ref 12–46)
Lymphs Abs: 1.7 10*3/uL (ref 0.7–4.0)
MCH: 29.1 pg (ref 26.0–34.0)
MCHC: 34.4 g/dL (ref 30.0–36.0)
MCV: 84.7 fL (ref 78.0–100.0)
MONO ABS: 0.9 10*3/uL (ref 0.1–1.0)
Monocytes Relative: 12 % (ref 3–12)
NEUTROS PCT: 62 % (ref 43–77)
Neutro Abs: 4.5 10*3/uL (ref 1.7–7.7)
Platelets: 218 10*3/uL (ref 150–400)
RBC: 4.57 MIL/uL (ref 4.22–5.81)
RDW: 14.3 % (ref 11.5–15.5)
WBC: 7.2 10*3/uL (ref 4.0–10.5)

## 2014-04-20 LAB — COMPREHENSIVE METABOLIC PANEL
ALBUMIN: 3.1 g/dL — AB (ref 3.5–5.2)
ALT: 23 U/L (ref 0–53)
AST: 19 U/L (ref 0–37)
Alkaline Phosphatase: 85 U/L (ref 39–117)
BUN: 10 mg/dL (ref 6–23)
CALCIUM: 8.7 mg/dL (ref 8.4–10.5)
CO2: 26 mEq/L (ref 19–32)
CREATININE: 0.92 mg/dL (ref 0.50–1.35)
Chloride: 102 mEq/L (ref 96–112)
GFR calc non Af Amer: >90 mL/min (ref >90)
Glucose, Bld: 90 mg/dL (ref 70–99)
Potassium: 3.4 mEq/L — ABNORMAL LOW (ref 3.7–5.3)
Sodium: 141 mEq/L (ref 137–147)
TOTAL PROTEIN: 6.7 g/dL (ref 6.0–8.3)
Total Bilirubin: 0.3 mg/dL (ref 0.3–1.2)

## 2014-04-20 LAB — URINALYSIS, ROUTINE W REFLEX MICROSCOPIC
Bilirubin Urine: NEGATIVE
Glucose, UA: NEGATIVE mg/dL
Ketones, ur: NEGATIVE mg/dL
NITRITE: NEGATIVE
PH: 7 (ref 5.0–8.0)
Protein, ur: NEGATIVE mg/dL
Specific Gravity, Urine: 1.014 (ref 1.005–1.030)
Urobilinogen, UA: 0.2 mg/dL (ref 0.0–1.0)

## 2014-04-20 LAB — URINE MICROSCOPIC-ADD ON

## 2014-04-20 NOTE — ED Notes (Signed)
Pt recently in hospital. Has 2 episodes of C diff. First time treated with flagyl, second time treated with vancomycin. Completed abx on Sunday. During vancomycin abx, pt started having bil feet swelling and urinating blood. Pt reports he also had CT recently that showed a 10mm encapsulated kidney stone. Hx of kidney stones. Foot pain 8/10.

## 2014-04-20 NOTE — ED Provider Notes (Signed)
CSN: [id number]     Arrival date & time 04/20/14  1549 History   First MD Initiated Contact with Patient 04/20/14 1605     Chief Complaint  Patient presents with  . Hematuria  . hx of cdiff, feet swelling      (Consider location/radiation/quality/duration/timing/severity/associated sxs/prior Treatment) Patient is a 58 y.o. male presenting with hematuria. The history is provided by the patient (the pt complains of blood in urine).  Hematuria This is a recurrent problem. The current episode started yesterday. The problem occurs constantly. The problem has not changed since onset.Pertinent negatives include no chest pain, no abdominal pain and no headaches. Nothing aggravates the symptoms. Nothing relieves the symptoms.    Past Medical History  Diagnosis Date  . Kidney stones   . Hypertension    Past Surgical History  Procedure Laterality Date  . Lithotripsy     Family History  Problem Relation Age of Onset  . Cancer - Prostate Father    History  Substance Use Topics  . Smoking status: Former Smoker    Quit date: 12/17/1984  . Smokeless tobacco: Not on file  . Alcohol Use: Yes     Comment: Socially    Review of Systems  Constitutional: Negative for appetite change and fatigue.  HENT: Negative for congestion, ear discharge and sinus pressure.   Eyes: Negative for discharge.  Respiratory: Negative for cough.   Cardiovascular: Negative for chest pain.  Gastrointestinal: Negative for abdominal pain and diarrhea.  Genitourinary: Positive for hematuria. Negative for frequency.  Musculoskeletal: Negative for back pain.       1 plus edema in legs  Skin: Negative for rash.  Neurological: Negative for seizures and headaches.  Psychiatric/Behavioral: Negative for hallucinations.      Allergies  Review of patient's allergies indicates no known allergies.  Home Medications   Prior to Admission medications   Medication Sig Start Date End Date Taking? Authorizing Provider   allopurinol (ZYLOPRIM) 100 MG tablet Take 100 mg by mouth daily.   Yes Historical Provider, MD  ALPRAZolam Prudy Feeler(XANAX) 0.25 MG tablet Take 0.25 mg by mouth at bedtime as needed for sleep.   Yes Historical Provider, MD  busPIRone (BUSPAR) 15 MG tablet Take 15 mg by mouth daily.   Yes Historical Provider, MD  escitalopram (LEXAPRO) 20 MG tablet Take 20 mg by mouth daily.   Yes Historical Provider, MD  indomethacin (INDOCIN) 50 MG capsule Take 1 capsule (50 mg total) by mouth 3 (three) times daily as needed for mild pain or moderate pain (For 3 days for gout pain). 03/18/14  Yes Elease EtienneAnand D Hongalgi, MD  losartan (COZAAR) 100 MG tablet Take 100 mg by mouth daily.   Yes Historical Provider, MD  Tetrahydrozoline HCl (VISINE OP) Place 1 drop into both eyes as needed (dry eyes).    Yes Historical Provider, MD  nystatin (MYCOSTATIN) 100000 UNIT/ML suspension Take 5 mLs by mouth 3 (three) times daily.    Historical Provider, MD  vancomycin (VANCOCIN) 50 mg/mL oral solution Take 2.5 mLs (125 mg total) by mouth 4 (four) times daily. Through 04/17/14. Compound to a suspension. Quantity: QS 04/06/14   Christiane Haorinna L Sullivan, MD   BP 164/85  Pulse 73  Temp(Src) 98.1 F (36.7 C) (Oral)  Resp 16  SpO2 97% Physical Exam  ED Course  Procedures (including critical care time) Labs Review Labs Reviewed  CBC WITH DIFFERENTIAL - Abnormal; Notable for the following:    HCT 38.7 (*)    All other components  within normal limits  COMPREHENSIVE METABOLIC PANEL - Abnormal; Notable for the following:    Potassium 3.4 (*)    Albumin 3.1 (*)    All other components within normal limits  URINALYSIS, ROUTINE W REFLEX MICROSCOPIC - Abnormal; Notable for the following:    Hgb urine dipstick LARGE (*)    Leukocytes, UA TRACE (*)    All other components within normal limits  URINE MICROSCOPIC-ADD ON    Imaging Review [facility]  04/20/2014   CLINICAL DATA:  Post lithotripsy  EXAM: RENAL/URINARY TRACT ULTRASOUND COMPLETE  COMPARISON:   CT 04/03/2014  FINDINGS: Right Kidney:  Length: 11.9 cm. Renal cortical echogenicity and thickness within normal limits. No hydronephrosis.  Left Kidney:  Length: 12.5 cm. Echogenicity within normal limits. No mass or hydronephrosis visualized. 1.2 cm left renal pelvic calculus identified.  Bladder:  Appears normal for degree of bladder distention.  IMPRESSION: 1.2 cm left renal pelvis calculus without hydronephrosis.   Electronically Signed   By: Christiana PellantGretchen  Green M.D.   On: 04/20/2014 17:13     EKG Interpretation None      MDM   Final diagnoses:  Hematuria        Benny LennertJoseph L Ladaja Yusupov, MD 04/20/14 [phone]

## 2014-04-20 NOTE — ED Notes (Signed)
Pt reports hematuria, denies dysuria.

## 2014-04-20 NOTE — Discharge Instructions (Signed)
Follow up with Dr. Berneice HeinrichManny urology or one of his partners.  Follow up with your family md next week

## 2014-04-21 ENCOUNTER — Other Ambulatory Visit: Payer: Self-pay | Admitting: Urology

## 2014-04-22 ENCOUNTER — Encounter (HOSPITAL_COMMUNITY): Payer: Self-pay | Admitting: Pharmacy Technician

## 2014-04-27 ENCOUNTER — Encounter (HOSPITAL_COMMUNITY): Payer: Self-pay

## 2014-04-27 ENCOUNTER — Encounter (HOSPITAL_COMMUNITY)
Admission: RE | Admit: 2014-04-27 | Discharge: 2014-04-27 | Disposition: A | Discharge status: Home / Self Care | Payer: Worker's Compensation | Source: Ambulatory Visit | Attending: Urology | Admitting: Urology

## 2014-04-27 DIAGNOSIS — R269 Unspecified abnormalities of gait and mobility: Secondary | ICD-10-CM

## 2014-04-27 DIAGNOSIS — N1330 Unspecified hydronephrosis: Secondary | ICD-10-CM | POA: Diagnosis not present

## 2014-04-27 DIAGNOSIS — Z87891 Personal history of nicotine dependence: Secondary | ICD-10-CM | POA: Diagnosis not present

## 2014-04-27 DIAGNOSIS — F329 Major depressive disorder, single episode, unspecified: Secondary | ICD-10-CM | POA: Diagnosis not present

## 2014-04-27 DIAGNOSIS — I10 Essential (primary) hypertension: Secondary | ICD-10-CM | POA: Diagnosis not present

## 2014-04-27 DIAGNOSIS — A0472 Enterocolitis due to Clostridium difficile, not specified as recurrent: Secondary | ICD-10-CM

## 2014-04-27 DIAGNOSIS — N200 Calculus of kidney: Secondary | ICD-10-CM | POA: Diagnosis present

## 2014-04-27 DIAGNOSIS — Z8042 Family history of malignant neoplasm of prostate: Secondary | ICD-10-CM | POA: Diagnosis not present

## 2014-04-27 DIAGNOSIS — F3289 Other specified depressive episodes: Secondary | ICD-10-CM | POA: Diagnosis not present

## 2014-04-27 HISTORY — DX: Enterocolitis due to Clostridium difficile, not specified as recurrent: A04.72

## 2014-04-27 HISTORY — DX: Unspecified abnormalities of gait and mobility: R26.9

## 2014-04-27 HISTORY — DX: Major depressive disorder, single episode, unspecified: F32.9

## 2014-04-27 HISTORY — DX: Depression, unspecified: F32.A

## 2014-04-27 MED ORDER — GENTAMICIN SULFATE 40 MG/ML IJ SOLN
5.0000 mg/kg | INTRAVENOUS | Status: AC
  Administered 2014-04-28: 400 mg via INTRAVENOUS
  Filled 2014-04-27: qty 10

## 2014-04-27 NOTE — Pre-Procedure Instructions (Addendum)
04-27-14 EKG done today. CT abdomen/pelvis with basilar lung view 04-03-14 Epic okay to use, no CXR needed today per Dr. Leta JunglingEwell. CBC,CMP done 04-20-14 Epic to be used. Zenaida NieceLaura Stines,Infection Prevention made aware -pt. Recent C.difficille- symptom free x 2 weeks- tx. With antibiotics x 2 rounds.

## 2014-04-27 NOTE — Patient Instructions (Signed)
20 Philomena CourseJohn Jacobsen  04/27/2014   Your procedure is scheduled on:  5-13 -2015  [facility] and [facility]. Arrive at   1:00 PM.  Call this number if you have problems the morning of surgery: [phone]  Or Presurgical Testing [phone](Stacyann Mcconaughy) For Living Will and/or Health Care Power Attorney Forms: please provide copy for your medical record,may bring AM of surgery(Forms should be already notarized -we do not provide this service).(04-27-14  No information preferred today).  Remember: Follow any bowel prep instructions per MD office.   Do not eat food:After Midnight.  May have clear liquids:up to 6 Hours before arrival. Nothing after : 0900 Am  Clear liquids include soda, tea, black coffee, apple or grape juice, broth.  Take these medicines the morning of surgery with A SIP OF WATER: Allopurinol. Buspirone, Lexapro.   Do not wear jewelry, make-up or nail polish.  Do not wear lotions, powders, or perfumes. You may wear deodorant.  Do not shave 48 hours(2 days) prior to first CHG shower(legs and under arms).(Shaving face and neck okay.)  Do not bring valuables to the hospital.(Hospital is not responsible for lost valuables).  Contacts, dentures or removable bridgework, body piercing, hair pins may not be worn into surgery.  Leave suitcase in the car. After surgery it may be brought to your room.  For patients admitted to the hospital, checkout time is 11:00 AM the day of discharge.(Restricted visitors-Any Persons displaying flu-like symptoms or illness).    Patients discharged the day of surgery will not be allowed to drive home. Must have responsible person with you x 24 hours once discharged.  Name and phone number of your driver: Rosa-spouse [phone] cell  Special Instructions: CHG(Chlorhedine 4%-"Hibiclens","Betasept","Aplicare") Shower Use Special Wash: see special instructions.(avoid face and genitals)     Failure to  follow these instructions may result in Cancellation of your surgery.   Patient signature_______________________________________________________

## 2014-04-28 ENCOUNTER — Ambulatory Visit (HOSPITAL_COMMUNITY): Payer: Worker's Compensation | Admitting: Anesthesiology

## 2014-04-28 ENCOUNTER — Encounter (HOSPITAL_COMMUNITY)
Admission: RE | Disposition: A | Discharge status: Home / Self Care | Payer: Self-pay | Source: Ambulatory Visit | Attending: Urology

## 2014-04-28 ENCOUNTER — Encounter (HOSPITAL_COMMUNITY): Payer: Self-pay | Admitting: *Deleted

## 2014-04-28 ENCOUNTER — Ambulatory Visit (HOSPITAL_COMMUNITY)
Admission: RE | Admit: 2014-04-28 | Discharge: 2014-04-28 | Disposition: A | Discharge status: Home / Self Care | Payer: Worker's Compensation | Source: Ambulatory Visit | Attending: Urology | Admitting: Urology

## 2014-04-28 ENCOUNTER — Encounter (HOSPITAL_COMMUNITY): Payer: Worker's Compensation | Admitting: Anesthesiology

## 2014-04-28 ENCOUNTER — Ambulatory Visit (HOSPITAL_COMMUNITY): Payer: Worker's Compensation

## 2014-04-28 DIAGNOSIS — N200 Calculus of kidney: Secondary | ICD-10-CM | POA: Insufficient documentation

## 2014-04-28 DIAGNOSIS — F3289 Other specified depressive episodes: Secondary | ICD-10-CM | POA: Insufficient documentation

## 2014-04-28 DIAGNOSIS — I10 Essential (primary) hypertension: Secondary | ICD-10-CM | POA: Insufficient documentation

## 2014-04-28 DIAGNOSIS — Z87891 Personal history of nicotine dependence: Secondary | ICD-10-CM | POA: Insufficient documentation

## 2014-04-28 DIAGNOSIS — F329 Major depressive disorder, single episode, unspecified: Secondary | ICD-10-CM | POA: Insufficient documentation

## 2014-04-28 DIAGNOSIS — N1330 Unspecified hydronephrosis: Secondary | ICD-10-CM | POA: Insufficient documentation

## 2014-04-28 DIAGNOSIS — Z8042 Family history of malignant neoplasm of prostate: Secondary | ICD-10-CM | POA: Insufficient documentation

## 2014-04-28 HISTORY — PX: CYSTOSCOPY WITH RETROGRADE PYELOGRAM, URETEROSCOPY AND STENT PLACEMENT: SHX5789

## 2014-04-28 HISTORY — PX: HOLMIUM LASER APPLICATION: SHX5852

## 2014-04-28 HISTORY — PX: CYSTOSCOPY WITH URETEROSCOPY AND STENT PLACEMENT: SHX6377

## 2014-04-28 SURGERY — CYSTOURETEROSCOPY, WITH RETROGRADE PYELOGRAM AND STENT INSERTION
Anesthesia: General | Site: Ureter | Laterality: Left

## 2014-04-28 MED ORDER — PROMETHAZINE HCL 25 MG/ML IJ SOLN: 6.2500 mg | INTRAMUSCULAR | Status: DC | PRN

## 2014-04-28 MED ORDER — SULFAMETHOXAZOLE-TMP DS 800-160 MG PO TABS: 1.0000 | ORAL_TABLET | Freq: Two times a day (BID) | ORAL | Status: DC

## 2014-04-28 MED ORDER — ONDANSETRON HCL 4 MG/2ML IJ SOLN
INTRAMUSCULAR | Status: AC
  Filled 2014-04-28: qty 2

## 2014-04-28 MED ORDER — SODIUM CHLORIDE 0.9 % IR SOLN
Status: DC | PRN
  Administered 2014-04-28: 4000 mL

## 2014-04-28 MED ORDER — LIDOCAINE HCL 2 % EX GEL
CUTANEOUS | Status: AC
  Filled 2014-04-28: qty 10

## 2014-04-28 MED ORDER — IOHEXOL 300 MG/ML  SOLN
INTRAMUSCULAR | Status: DC | PRN
  Administered 2014-04-28: 40 mL

## 2014-04-28 MED ORDER — SENNOSIDES-DOCUSATE SODIUM 8.6-50 MG PO TABS: 1.0000 | ORAL_TABLET | Freq: Two times a day (BID) | ORAL | Status: DC

## 2014-04-28 MED ORDER — GENTAMICIN IN SALINE 1.6-0.9 MG/ML-% IV SOLN: 80.0000 mg | INTRAVENOUS | Status: DC

## 2014-04-28 MED ORDER — FENTANYL CITRATE 0.05 MG/ML IJ SOLN
INTRAMUSCULAR | Status: DC | PRN
  Administered 2014-04-28 (×4): 50 ug via INTRAVENOUS

## 2014-04-28 MED ORDER — PROPOFOL 10 MG/ML IV BOLUS
INTRAVENOUS | Status: AC
  Filled 2014-04-28: qty 20

## 2014-04-28 MED ORDER — PROPOFOL 10 MG/ML IV BOLUS
INTRAVENOUS | Status: DC | PRN
  Administered 2014-04-28: 200 mg via INTRAVENOUS

## 2014-04-28 MED ORDER — LACTATED RINGERS IV SOLN
INTRAVENOUS | Status: DC | PRN
  Administered 2014-04-28: 15:00:00 via INTRAVENOUS

## 2014-04-28 MED ORDER — MIDAZOLAM HCL 2 MG/2ML IJ SOLN
INTRAMUSCULAR | Status: AC
  Filled 2014-04-28: qty 2

## 2014-04-28 MED ORDER — BELLADONNA ALKALOIDS-OPIUM 16.2-60 MG RE SUPP
RECTAL | Status: AC
  Filled 2014-04-28: qty 1

## 2014-04-28 MED ORDER — FENTANYL CITRATE 0.05 MG/ML IJ SOLN
INTRAMUSCULAR | Status: AC
  Filled 2014-04-28: qty 5

## 2014-04-28 MED ORDER — ONDANSETRON HCL 4 MG/2ML IJ SOLN
INTRAMUSCULAR | Status: DC | PRN
  Administered 2014-04-28: 4 mg via INTRAVENOUS

## 2014-04-28 MED ORDER — FENTANYL CITRATE 0.05 MG/ML IJ SOLN: 25.0000 ug | INTRAMUSCULAR | Status: DC | PRN

## 2014-04-28 MED ORDER — DEXAMETHASONE SODIUM PHOSPHATE 10 MG/ML IJ SOLN
INTRAMUSCULAR | Status: DC | PRN
  Administered 2014-04-28: 10 mg via INTRAVENOUS

## 2014-04-28 MED ORDER — KETOROLAC TROMETHAMINE 30 MG/ML IJ SOLN: 15.0000 mg | Freq: Once | INTRAMUSCULAR | Status: DC | PRN

## 2014-04-28 MED ORDER — DEXAMETHASONE SODIUM PHOSPHATE 10 MG/ML IJ SOLN
INTRAMUSCULAR | Status: AC
  Filled 2014-04-28: qty 1

## 2014-04-28 MED ORDER — OXYBUTYNIN CHLORIDE 5 MG PO TABS: 5.0000 mg | ORAL_TABLET | Freq: Three times a day (TID) | ORAL | Status: DC | PRN

## 2014-04-28 MED ORDER — 0.9 % SODIUM CHLORIDE (POUR BTL) OPTIME
TOPICAL | Status: DC | PRN
  Administered 2014-04-28: 1000 mL

## 2014-04-28 MED ORDER — LACTATED RINGERS IV SOLN
INTRAVENOUS | Status: DC
  Administered 2014-04-28: 18:00:00 via INTRAVENOUS

## 2014-04-28 MED ORDER — OXYCODONE-ACETAMINOPHEN 5-325 MG PO TABS: 1.0000 | ORAL_TABLET | Freq: Four times a day (QID) | ORAL | Status: DC | PRN

## 2014-04-28 MED ORDER — MIDAZOLAM HCL 5 MG/5ML IJ SOLN
INTRAMUSCULAR | Status: DC | PRN
  Administered 2014-04-28: 2 mg via INTRAVENOUS

## 2014-04-28 SURGICAL SUPPLY — 23 items
BAG URINE DRAINAGE (UROLOGICAL SUPPLIES) ×4 IMPLANT
BASKET LASER NITINOL 1.9FR (BASKET) ×4 IMPLANT
BASKET STNLS GEMINI 4WIRE 3FR (BASKET) IMPLANT
BASKET ZERO TIP NITINOL 2.4FR (BASKET) IMPLANT
CATH INTERMIT  6FR 70CM (CATHETERS) ×4 IMPLANT
CLOTH BEACON ORANGE TIMEOUT ST (SAFETY) ×4 IMPLANT
DRAPE CAMERA CLOSED 9X96 (DRAPES) ×4 IMPLANT
ELECT REM PT RETURN 9FT ADLT (ELECTROSURGICAL)
ELECTRODE REM PT RTRN 9FT ADLT (ELECTROSURGICAL) IMPLANT
FIBER LASER FLEXIVA 200 (UROLOGICAL SUPPLIES) ×4 IMPLANT
FIBER LASER FLEXIVA 365 (UROLOGICAL SUPPLIES) IMPLANT
GLOVE BIOGEL M STRL SZ7.5 (GLOVE) ×4 IMPLANT
GOWN STRL REUS W/TWL LRG LVL3 (GOWN DISPOSABLE) ×12 IMPLANT
GUIDEWIRE ANG ZIPWIRE 038X150 (WIRE) ×4 IMPLANT
GUIDEWIRE STR DUAL SENSOR (WIRE) ×4 IMPLANT
IV NS IRRIG 3000ML ARTHROMATIC (IV SOLUTION) ×4 IMPLANT
PACK CYSTO (CUSTOM PROCEDURE TRAY) ×4 IMPLANT
SHEATH ACCESS URETERAL 38CM (SHEATH) ×4 IMPLANT
STENT POLARIS 5FRX26 (STENTS) ×4 IMPLANT
SYR 30ML LL (SYRINGE) ×4 IMPLANT
SYRINGE 10CC LL (SYRINGE) IMPLANT
SYRINGE IRR TOOMEY STRL 70CC (SYRINGE) IMPLANT
TUBE FEEDING 8FR 16IN STR KANG (MISCELLANEOUS) ×4 IMPLANT

## 2014-04-28 NOTE — Discharge Instructions (Signed)
1 - You may have urinary urgency (bladder spasms) and bloody urine on / off with stent in place. This is normal. ° °2 - Call MD or go to ER for fever >102, severe pain / nausea / vomiting not relieved by medications, or acute change in medical status ° °

## 2014-04-28 NOTE — Anesthesia Postprocedure Evaluation (Signed)
  Anesthesia Post-op Note  Patient: Philomena CourseJohn Restivo  Procedure(s) Performed: Procedure(s) (LRB): CYSTOSCOPY WITH BILATERAL RETROGRADE PYELOGRAM (Bilateral) HOLMIUM LASER APPLICATION (Left)  URETEROSCOPY AND STENT PLACEMENT (Left)  Patient Location: PACU  Anesthesia Type: General  Level of Consciousness: awake and alert   Airway and Oxygen Therapy: Patient Spontanous Breathing  Post-op Pain: mild  Post-op Assessment: Post-op Vital signs reviewed, Patient's Cardiovascular Status Stable, Respiratory Function Stable, Patent Airway and No signs of Nausea or vomiting  Last Vitals:  Filed Vitals:   04/28/14 1805  BP: 133/87  Pulse: 72  Temp: 36.8 C  Resp: 16    Post-op Vital Signs: stable   Complications: No apparent anesthesia complications

## 2014-04-28 NOTE — H&P (Signed)
[name] CourseJohn [name] is an 58 y.o. male.    Chief Complaint: Pre-Op Left Ureteroscopic Stone Manipulation, Bilateral Retrogrades  HPI:    1 - Nephrolithiasis - Left 10mm UPJ and 8mm left lower pole stones, SSD 12cm, 1300HU by CT 03/2014 on eval malaise, flank pain and hematuria. Rt punctate stone as well. Has had several episodes of passage of small stones with medical therapy. One prior SWL that was not succesful. Most recent UCX negaive. He does have Gout.  2 - Gross Hematuria - New hematuria 03/2014, non-contrast CT with stones as per above. Remote 10PY  smoker. No dye / chemical exposure.  3 - Prostate Screening - Pt's father with prostate cancer 03/2014 DRE 40gm smooth / PSA normal  PMH sig for diverticulitis, piror C.Diff colitis, Gout. NO prior surgeries. NO CV disease.  Today [name] [name] is seen to proceed with left ureteroscopic stone manipulation for his symptomatic stone. Most recent UA without infectious parameters. No interval fevers.   Past Medical History  Diagnosis Date  . Hypertension   . Depression   . Kidney stones   . C. difficile colitis 04-27-14    recent antibiotic use x2 rounds last taken 2 weeks ago, no symptoms in 2-3 weeks  . Abnormal gait 04-27-14    "limping gait since bout with C. difficille"    Past Surgical History  Procedure Laterality Date  . Lithotripsy      Family History  Problem Relation Age of Onset  . Cancer - Prostate Father    Social History:  reports that he quit smoking about 29 years ago. He does not have any smokeless tobacco history on file. He reports that he drinks alcohol. He reports that he does not use illicit drugs.  Allergies: No Known Allergies  No prescriptions prior to admission    No results found for this or any previous visit (from the past 48 hour(s)). No results found.  Review of Systems  Constitutional: Positive for malaise/fatigue. Negative for fever and chills.  HENT: Negative.   Eyes: Negative.   Respiratory: Negative.    Cardiovascular: Negative.   Gastrointestinal: Negative.   Genitourinary: Negative.   Musculoskeletal: Negative.   Skin: Negative.   Neurological: Negative.   Endo/Heme/Allergies: Negative.   Psychiatric/Behavioral: Negative.     There were no vitals taken for this visit. Physical Exam  Constitutional: He is oriented to person, place, and time. He appears well-developed and well-nourished.  HENT:  Head: Normocephalic and atraumatic.  Eyes: EOM are normal. Pupils are equal, round, and reactive to light.  Neck: Normal range of motion. Neck supple.  Cardiovascular: Normal rate.   Respiratory: Effort normal.  GI: Soft. Bowel sounds are normal.  Genitourinary:  Mild left CVAT  Musculoskeletal: Normal range of motion.  Neurological: He is alert and oriented to person, place, and time.  Skin: Skin is warm and dry.  Psychiatric: He has a normal mood and affect. His behavior is normal. Judgment and thought content normal.     Assessment/Plan      1 - Nephrolithiasis - Likely intermitantly obstruitng left UPJ stone as source of hematuria and left flank pain.  We rediscussed ureteroscopic stone manipulation with basketing and laser-lithotripsy in detail.  We rediscussed risks including bleeding, infection, damage to kidney / ureter  bladder, rarely loss of kidney. We discussed anesthetic risks and rare but serious surgical complications including DVT, PE, MI, and mortality. We specifically readdressed that in 5-10% of cases a staged approach is required with stenting followed by re-attempt  ureteroscopy if anatomy unfavorable. The patient voiced understanding and wishes to proceed today as planned.   2 - Gross Hematuria - likely from stones as per above. Will perform cysto / retrograds as a part of stone surgery to complete eval.   3 - Prostate Screening - up to date this year.  Sebastian Acheheodore Jamell Laymon 04/28/2014, 10:13 AM

## 2014-04-28 NOTE — Brief Op Note (Signed)
04/28/2014  5:16 PM  PATIENT:  Philomena CourseJohn Bhattacharyya  58 y.o. male  PRE-OPERATIVE DIAGNOSIS:  LEFT RENAL STONES, HEMATURIA  POST-OPERATIVE DIAGNOSIS:  Left Renal Stones, Hematuria  PROCEDURE:  Procedure(s): CYSTOSCOPY WITH BILATERAL RETROGRADE PYELOGRAM (Bilateral) HOLMIUM LASER APPLICATION (Left)  URETEROSCOPY AND STENT PLACEMENT (Left)  SURGEON:  Surgeon(s) and Role:    * Sebastian Acheheodore Luda Charbonneau, MD - Primary  PHYSICIAN ASSISTANT:   ASSISTANTS: none   ANESTHESIA:   general  EBL:     BLOOD ADMINISTERED:none  DRAINS: none   LOCAL MEDICATIONS USED:  NONE  SPECIMEN:  Source of Specimen:  Left Renal Stones  DISPOSITION OF SPECIMEN:  [facility] for compositional analysis  COUNTS:  YES  TOURNIQUET:  * No tourniquets in log *  DICTATION: .Other Dictation: Dictation Number  [id number]  PLAN OF CARE: Discharge to home after PACU  PATIENT DISPOSITION:  PACU - hemodynamically stable.   Delay start of Pharmacological VTE agent (>24hrs) due to surgical blood loss or risk of bleeding: not applicable

## 2014-04-28 NOTE — Transfer of Care (Signed)
Immediate Anesthesia Transfer of Care Note  Patient: [name] [name]  Procedure(s) Performed: Procedure(s) (LRB): CYSTOSCOPY WITH BILATERAL RETROGRADE PYELOGRAM (Bilateral) HOLMIUM LASER APPLICATION (Left)  URETEROSCOPY AND STENT PLACEMENT (Left)  Patient Location: PACU  Anesthesia Type: General  Level of Consciousness: sedated, patient cooperative and responds to stimulation  Airway & Oxygen Therapy: Patient Spontanous Breathing and Patient connected to face mask oxgen  Post-op Assessment: Report given to PACU RN and Post -op Vital signs reviewed and stable  Post vital signs: Reviewed and stable  Complications: No apparent anesthesia complications

## 2014-04-28 NOTE — Anesthesia Preprocedure Evaluation (Addendum)
Anesthesia Evaluation  Patient identified by MRN, date of birth, ID band Patient awake    Reviewed: Allergy & Precautions, H&P , NPO status , Patient's Chart, lab work & pertinent test results  Airway Mallampati: III TM Distance: <3 FB Neck ROM: Full    Dental  (+) Dental Advisory Given   Pulmonary neg pulmonary ROS, former smoker,  breath sounds clear to auscultation  Pulmonary exam normal       Cardiovascular hypertension, Pt. on medications Rhythm:Regular Rate:Normal     Neuro/Psych negative neurological ROS  negative psych ROS   GI/Hepatic negative GI ROS, Neg liver ROS,   Endo/Other  negative endocrine ROS  Renal/GU negative Renal ROS  negative genitourinary   Musculoskeletal negative musculoskeletal ROS (+)   Abdominal   Peds negative pediatric ROS (+)  Hematology negative hematology ROS (+)   Anesthesia Other Findings   Reproductive/Obstetrics negative OB ROS                          Anesthesia Physical Anesthesia Plan  ASA: II  Anesthesia Plan: General   Post-op Pain Management:    Induction: Intravenous  Airway Management Planned: LMA  Additional Equipment:   Intra-op Plan:   Post-operative Plan:   Informed Consent: I have reviewed the patients History and Physical, chart, labs and discussed the procedure including the risks, benefits and alternatives for the proposed anesthesia with the patient or authorized representative who has indicated his/her understanding and acceptance.   Dental advisory given  Plan Discussed with: CRNA and Surgeon  Anesthesia Plan Comments:         Anesthesia Quick Evaluation

## 2014-04-29 NOTE — Op Note (Signed)
NAME:  Otelia SanteeSHOFFNER, Kayton               ACCOUNT NO.:  [id number]  MEDICAL RECORD NO.:  [id number]  LOCATION:  [facility]                         [facility]  PHYSICIAN:  Sebastian Acheheodore Garin Mata, MD     DATE OF BIRTH:  [birth year]  DATE OF PROCEDURE:  04/28/2014 DATE OF DISCHARGE:  04/28/2014                              OPERATIVE REPORT   DIAGNOSES:  Gross hematuria, left renal stone.  PROCEDURE: 1. Cystoscopy with bilateral retrograde pyelograms and interpretation. 2. Left ureteroscopy with laser lithotripsy. 3. Insertion of left ureteral stent, 5 x 26, no tether.  FINDINGS: 1. Unremarkable urinary bladder. 2. Unremarkable right retrograde pyelogram. 3. Left retrograde pyelogram with large filling defect of the UPJ,     consistent with known stone with mild hydronephrosis. 4. Multifocal large volume left renal stone including dominant left     UPJ stone as well as a fusiform stone in the left lower pole.  ESTIMATED BLOOD LOSS:  Nil.  SPECIMEN:  Left renal stone fragments for compositional analysis.  INDICATION:  Mr. Genelle GatherShoffner is a 58 year old gentleman with history of prior nephrolithiasis who has had on and off gross hematuria for sometime.  He underwent evaluation with axial imaging, which revealed multifocal left renal stone as well as some mild hydronephrosis.  He had contrast imaging several months ago, which revealed no solid renal masses.  He did not have ureteral contrast at that time.  Options were discussed including operative cystoscopy with bilateral retrograde pyelograms and left ureteroscopic stone manipulation in order to complete his hematuria evaluation and address his left-sided stone, which is the likely source of this.  He wished to proceed.  Informed consent was obtained and placed in the medical record.  PROCEDURE IN DETAIL:  The patient being, Philomena CourseJohn Olivos, was verified. Procedure being cysto bilateral retrograde and left ureteroscopic stone manipulation was  confirmed, procedure was carried out.  Time-out was performed.  Intravenous antibiotics were administered.  General LMA anesthesia was introduced.  The patient was placed into a low-lithotomy position.  Sterile field was created by prepping and draping the patient's penis, perineum, and proximal thighs using iodine x3.  Next, cystourethroscopy was performed using a 22-French rigid cystoscope with 12-degree offset lens.  Inspection of the anterior and posterior urethra were unremarkable.  Inspection of the urinary bladder revealed no diverticula, calcifications, papular lesions.  The right ureteral orifice was cannulated with a 6-French end-hole catheter and right retrograde pyelogram was seen.  Right retrograde pyelogram demonstrated a single right ureter, a single system right kidney.  No filling defects or narrowing noted.  Next, the left ureteral orifice was cannulated with a 6-French end-hole catheter and left retrograde pyelogram was obtained.  Left retrograde pyelogram demonstrated a single left ureter with a single system left kidney.  There was a large filling defect of the UPJ consistent with known large stone.  There was mild hydronephrosis.  A 0.038 Glidewire was advanced at the level of the upper pole and set aside as a safety wire.  Next, semi-rigid ureteroscopy was performed of the distal two-thirds of left ureter alongside a separate Sensor working wire with an 8-French feeding tube in the urinary bladder for pressure release.  Inspection  of the ureter revealed no calcifications or mucosal abnormalities.  Next, the semi-rigid ureteroscope was exchanged for the 12/14 38-cm ureteral access sheath to the level of the proximal ureter, and flexible digital ureteroscopy was performed.  Inspection of the proximal ureter revealed no mucosal abnormalities.  Panendoscopic examination of the kidney with each calyx x2 revealed large left renal pelvis stone and a large fusiform lower  pole stone as anticipated. Photo documentation was performed given the large burden of the renal pelvis stone, so that a dusting technique would be most prudent to begin with.  As such, a 200 nanometer laser fiber was used and holmium laser energy was applied in the stone using a setting of 0.3 joules and 20 Hz, which we were able to completely ablate and dust 75% of the dominant renal pelvis stone and these fragments were irrigated.  The remaining 25% of the stone was fragmented into pieces 2-3 mm in size which were then sequentially grasped, brought out in their entirety, set aside for compositional analysis.  The area of very large fusiform lower pole stone was grasped with escape basket and repositioned into the upper pole and fragmented into 4 smaller pieces which were then each removed and set aside for compositional analysis.  Repeat panendoscopic examination of the left kidney revealed complete resolution of all stone fragments larger than 1/3rd mm. No evidence of perforation.  There was excellent hemostasis.  Given the amount of manipulation required and the fact that the dusting technique had been used with likely persistence or with likely a need for continued evacuation of small fragments, it was felt that ureteral stenting was warranted.  The access sheath was removed under continuous ureteroscopic vision.  No mucosal abnormalities were found.  Finally, a new 5 x 26 prolaris stent was placed with remaining safety wire using cystoscopic and fluoroscopic guidance, proximal end in the upper pole, distal end in the urinary bladder. Bladder was emptied per cystoscope.  Procedure was then terminated.  The patient tolerated the procedure well.  There were no immediate periprocedural complications.  The patient was taken to postanesthesia care unit in stable condition.          ______________________________ Sebastian Acheheodore Mcgwire Dasaro, MD     TM/MEDQ  D:  04/28/2014  T:  04/29/2014  Job:   [id number]

## 2014-04-30 ENCOUNTER — Encounter (HOSPITAL_COMMUNITY): Payer: Self-pay | Admitting: Urology

## 2014-05-03 ENCOUNTER — Encounter (HOSPITAL_COMMUNITY): Payer: Self-pay | Admitting: Emergency Medicine

## 2014-05-03 ENCOUNTER — Emergency Department (HOSPITAL_COMMUNITY)
Admission: EM | Admit: 2014-05-03 | Discharge: 2014-05-03 | Disposition: A | Discharge status: Home / Self Care | Payer: BC Managed Care – PPO | Attending: Emergency Medicine | Admitting: Emergency Medicine

## 2014-05-03 DIAGNOSIS — F3289 Other specified depressive episodes: Secondary | ICD-10-CM | POA: Insufficient documentation

## 2014-05-03 DIAGNOSIS — Z79899 Other long term (current) drug therapy: Secondary | ICD-10-CM | POA: Insufficient documentation

## 2014-05-03 DIAGNOSIS — I10 Essential (primary) hypertension: Secondary | ICD-10-CM | POA: Diagnosis not present

## 2014-05-03 DIAGNOSIS — R197 Diarrhea, unspecified: Secondary | ICD-10-CM | POA: Diagnosis present

## 2014-05-03 DIAGNOSIS — Z87442 Personal history of urinary calculi: Secondary | ICD-10-CM | POA: Diagnosis not present

## 2014-05-03 DIAGNOSIS — R5381 Other malaise: Secondary | ICD-10-CM | POA: Insufficient documentation

## 2014-05-03 DIAGNOSIS — R5383 Other fatigue: Secondary | ICD-10-CM

## 2014-05-03 DIAGNOSIS — F329 Major depressive disorder, single episode, unspecified: Secondary | ICD-10-CM | POA: Insufficient documentation

## 2014-05-03 DIAGNOSIS — A0472 Enterocolitis due to Clostridium difficile, not specified as recurrent: Secondary | ICD-10-CM | POA: Insufficient documentation

## 2014-05-03 DIAGNOSIS — Z87891 Personal history of nicotine dependence: Secondary | ICD-10-CM | POA: Diagnosis not present

## 2014-05-03 DIAGNOSIS — Z791 Long term (current) use of non-steroidal anti-inflammatories (NSAID): Secondary | ICD-10-CM | POA: Diagnosis not present

## 2014-05-03 LAB — CBC WITH DIFFERENTIAL/PLATELET
BASOS ABS: 0.1 10*3/uL (ref 0.0–0.1)
Basophils Relative: 0 % (ref 0–1)
EOS PCT: 1 % (ref 0–5)
Eosinophils Absolute: 0.1 10*3/uL (ref 0.0–0.7)
HCT: 43.9 % (ref 39.0–52.0)
Hemoglobin: 14.7 g/dL (ref 13.0–17.0)
LYMPHS ABS: 2.1 10*3/uL (ref 0.7–4.0)
Lymphocytes Relative: 16 % (ref 12–46)
MCH: 28.2 pg (ref 26.0–34.0)
MCHC: 33.5 g/dL (ref 30.0–36.0)
MCV: 84.1 fL (ref 78.0–100.0)
Monocytes Absolute: 1.2 10*3/uL — ABNORMAL HIGH (ref 0.1–1.0)
Monocytes Relative: 9 % (ref 3–12)
NEUTROS ABS: 9.8 10*3/uL — AB (ref 1.7–7.7)
NEUTROS PCT: 74 % (ref 43–77)
PLATELETS: 280 10*3/uL (ref 150–400)
RBC: 5.22 MIL/uL (ref 4.22–5.81)
RDW: 14 % (ref 11.5–15.5)
WBC: 13.4 10*3/uL — ABNORMAL HIGH (ref 4.0–10.5)

## 2014-05-03 LAB — BASIC METABOLIC PANEL
BUN: 10 mg/dL (ref 6–23)
CALCIUM: 9.3 mg/dL (ref 8.4–10.5)
CHLORIDE: 98 meq/L (ref 96–112)
CO2: 23 meq/L (ref 19–32)
Creatinine, Ser: 0.81 mg/dL (ref 0.50–1.35)
GFR calc Af Amer: >90 mL/min (ref >90)
GFR calc non Af Amer: >90 mL/min (ref >90)
Glucose, Bld: 134 mg/dL — ABNORMAL HIGH (ref 70–99)
POTASSIUM: 3.3 meq/L — AB (ref 3.7–5.3)
SODIUM: 138 meq/L (ref 137–147)

## 2014-05-03 LAB — URINALYSIS, ROUTINE W REFLEX MICROSCOPIC
Glucose, UA: NEGATIVE mg/dL
Ketones, ur: NEGATIVE mg/dL
Nitrite: NEGATIVE
Protein, ur: 100 mg/dL — AB
Specific Gravity, Urine: 1.014 (ref 1.005–1.030)
Urobilinogen, UA: 0.2 mg/dL (ref 0.0–1.0)
pH: 6.5 (ref 5.0–8.0)

## 2014-05-03 LAB — URINE MICROSCOPIC-ADD ON

## 2014-05-03 MED ORDER — VANCOMYCIN 50 MG/ML ORAL SOLUTION
250.0000 mg | Freq: Four times a day (QID) | ORAL | Status: DC
  Filled 2014-05-03 (×3): qty 5

## 2014-05-03 MED ORDER — VANCOMYCIN 50 MG/ML ORAL SOLUTION: 125.0000 mg | Freq: Once | ORAL | Status: DC

## 2014-05-03 MED ORDER — SODIUM CHLORIDE 0.9 % IV BOLUS (SEPSIS)
1000.0000 mL | INTRAVENOUS | Status: AC
  Administered 2014-05-03: 1000 mL via INTRAVENOUS

## 2014-05-03 MED ORDER — VANCOMYCIN 50 MG/ML ORAL SOLUTION
125.0000 mg | Freq: Once | ORAL | Status: DC
  Filled 2014-05-03: qty 2.5

## 2014-05-03 MED ORDER — VANCOMYCIN 50 MG/ML ORAL SOLUTION
125.0000 mg | Freq: Four times a day (QID) | ORAL | Status: DC
  Administered 2014-05-03: 125 mg via ORAL
  Filled 2014-05-03 (×2): qty 2.5

## 2014-05-03 MED ORDER — VANCOMYCIN HCL 250 MG PO CAPS: 250.0000 mg | ORAL_CAPSULE | Freq: Four times a day (QID) | ORAL | Status: DC

## 2014-05-03 MED ORDER — VANCOMYCIN HCL 125 MG PO CAPS: 125.0000 mg | ORAL_CAPSULE | Freq: Four times a day (QID) | ORAL | Status: DC

## 2014-05-03 NOTE — ED Notes (Signed)
Pt in bathroom

## 2014-05-03 NOTE — ED Notes (Signed)
[place] IV team at bedside

## 2014-05-03 NOTE — Discharge Instructions (Signed)
Take Vancomycin as directed until gone. [facility] for further evaluation and management of your C. Diff. Refer to attached documents for more information.

## 2014-05-03 NOTE — ED Provider Notes (Signed)
CSN: [id number]     Arrival date & time 05/03/14  1208 History   First MD Initiated Contact with Patient 05/03/14 1309     Chief Complaint  Patient presents with  . Diarrhea  . c diff      (Consider location/radiation/quality/duration/timing/severity/associated sxs/prior Treatment) HPI Comments: Patient is a 58 year old male with a past medical history of hypertension and 2 episodes of C. Diff colitis in the past 2 months who presents with abdominal pain with associated diarrhea since yesterday. He had 6 episodes of diarrhea yesterday. The pain is located in his generalized abdomen and does not radiate. The pain is described as cramping and severe. The pain started gradually and progressively worsened since the onset. No alleviating/aggravating factors. The patient has tried nothing for symptoms without relief. Associated symptoms include diarrhea. 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 Patient has had the infection twice since March 2015. Patient reports he "can smell the infection" in his stool this time. Patient reports generalized weakness as well. His PCP instructed him to come to the ED for further evaluation. Patient denies fever, headache, nausea, vomiting, chest pain.   Past Medical History  Diagnosis Date  . Hypertension   . Depression   . Kidney stones   . C. difficile colitis 04-27-14    recent antibiotic use x2 rounds last taken 2 weeks ago, no symptoms in 2-3 weeks  . Abnormal gait 04-27-14    "limping gait since bout with C. difficille"   Past Surgical History  Procedure Laterality Date  . Lithotripsy    . No past surgeries    . Cystoscopy with retrograde pyelogram, ureteroscopy and stent placement Bilateral 04/28/2014    Procedure: CYSTOSCOPY WITH BILATERAL RETROGRADE PYELOGRAM;  Surgeon: Sebastian Acheheodore Manny, MD;  Location: [facility];  Service: Urology;  Laterality: Bilateral;  . Holmium  laser application Left 04/28/2014    Procedure: HOLMIUM LASER APPLICATION;  Surgeon: Sebastian Acheheodore Manny, MD;  Location: [facility];  Service: Urology;  Laterality: Left;  . Cystoscopy with ureteroscopy and stent placement Left 04/28/2014    Procedure:  URETEROSCOPY AND STENT PLACEMENT;  Surgeon: Sebastian Acheheodore Manny, MD;  Location: [facility];  Service: Urology;  Laterality: Left;   Family History  Problem Relation Age of Onset  . Cancer - Prostate Father    History  Substance Use Topics  . Smoking status: Former Smoker    Quit date: 12/17/1984  . Smokeless tobacco: Never Used  . Alcohol Use: Yes     Comment: Socially    Review of Systems  Constitutional: Negative for fever, chills and fatigue.  HENT: Negative for trouble swallowing.   Eyes: Negative for visual disturbance.  Respiratory: Negative for shortness of breath.   Cardiovascular: Negative for chest pain and palpitations.  Gastrointestinal: Positive for abdominal pain and diarrhea. Negative for nausea and vomiting.  Genitourinary: Negative for dysuria and difficulty urinating.  Musculoskeletal: Negative for arthralgias and neck pain.  Skin: Negative for color change.  Neurological: Positive for weakness. Negative for dizziness.  Psychiatric/Behavioral: Negative for dysphoric mood.      Allergies  No known allergies  Home Medications   Prior to Admission medications   Medication Sig Start Date End Date Taking? Authorizing Provider  acidophilus (RISAQUAD) CAPS capsule Take 1 capsule by mouth daily.   Yes Historical Provider, MD  allopurinol (ZYLOPRIM) 100 MG tablet Take 100 mg by mouth daily.   Yes  Historical Provider, MD  ALPRAZolam Prudy Feeler(XANAX) 0.25 MG tablet Take 0.25 mg by mouth at bedtime as needed for sleep.   Yes Historical Provider, MD  busPIRone (BUSPAR) 15 MG tablet Take 15 mg by mouth every morning.    Yes Historical Provider, MD  escitalopram (LEXAPRO) 20 MG tablet Take 20 mg by mouth every morning.    Yes Historical Provider,  MD  indomethacin (INDOCIN) 50 MG capsule Take 50 mg by mouth 3 (three) times daily as needed for mild pain.   Yes Historical Provider, MD  losartan (COZAAR) 100 MG tablet Take 100 mg by mouth every morning.    Yes Historical Provider, MD  oxybutynin (DITROPAN) 5 MG tablet Take 1 tablet (5 mg total) by mouth every 8 (eight) hours as needed for bladder spasms. / stent discomfort 04/28/14  Yes Sebastian Acheheodore Manny, MD  oxyCODONE-acetaminophen (ROXICET) 5-325 MG per tablet Take 1-2 tablets by mouth every 6 (six) hours as needed for moderate pain or severe pain. Post-operatively 04/28/14  Yes Sebastian Acheheodore Manny, MD  Tetrahydrozoline HCl (VISINE OP) Place 1 drop into both eyes 3 (three) times daily as needed (dry eyes).    Yes Historical Provider, MD  senna-docusate (SENOKOT-S) 8.6-50 MG per tablet Take 1 tablet by mouth 2 (two) times daily. While taking pain meds to prevent constipation 04/28/14   Sebastian Acheheodore Manny, MD  sulfamethoxazole-trimethoprim (BACTRIM DS) 800-160 MG per tablet Take 1 tablet by mouth 2 (two) times daily. X 3 days. Begin day prior to next Urology appointment 04/28/14   Sebastian Acheheodore Manny, MD   BP 125/78  Pulse 94  Resp 16  Ht 5\' 7"  (1.702 m)  Wt 214 lb (97.07 kg)  BMI 33.51 kg/m2  SpO2 96% Physical Exam  Nursing note and vitals reviewed. Constitutional: He is oriented to person, place, and time. He appears well-developed and well-nourished. No distress.  HENT:  Head: Normocephalic and atraumatic.  Eyes: Conjunctivae are normal.  Neck: Normal range of motion.  Cardiovascular: Normal rate and regular rhythm.  Exam reveals no gallop and no friction rub.   No murmur heard. Pulmonary/Chest: Effort normal and breath sounds normal. He has no wheezes. He has no rales. He exhibits no tenderness.  Abdominal: Soft. He exhibits no distension. There is tenderness. There is no rebound and no guarding.  Left lower quadrant tenderness to palpation. No peritoneal signs or other focal tenderness to palpation.    Musculoskeletal: Normal range of motion.  Neurological: He is alert and oriented to person, place, and time. Coordination normal.  Speech is goal-oriented. Moves limbs without ataxia.   Skin: Skin is warm and dry.  Psychiatric: He has a normal mood and affect. His behavior is normal.    ED Course  Procedures (including critical care time) Labs Review Labs Reviewed  CBC WITH DIFFERENTIAL - Abnormal; Notable for the following:    WBC 13.4 (*)    Neutro Abs 9.8 (*)    Monocytes Absolute 1.2 (*)    All other components within normal limits  BASIC METABOLIC PANEL - Abnormal; Notable for the following:    Potassium 3.3 (*)    Glucose, Bld 134 (*)    All other components within normal limits  URINALYSIS, ROUTINE W REFLEX MICROSCOPIC - Abnormal; Notable for the following:    Color, Urine RED (*)    APPearance TURBID (*)    Hgb urine dipstick LARGE (*)    Bilirubin Urine SMALL (*)    Protein, ur 100 (*)    Leukocytes, UA LARGE (*)  All other components within normal limits  URINE MICROSCOPIC-ADD ON - Abnormal; Notable for the following:    Bacteria, UA FEW (*)    All other components within normal limits  STOOL CULTURE  CLOSTRIDIUM DIFFICILE BY PCR  URINE CULTURE    Imaging Review No results found.   EKG Interpretation None      MDM   Final diagnoses:  Clostridium difficile colitis   2:42 PM Patient has elevated WBC at 13.4. Hemoglobin is stable. Patient stool sample sent for culture. Vitals stable and patient afebrile. Patient will be discharged with PO Vancomycin. Patient will be instructed to follow up with Gastroenterology. Patient advised to return with worsening or concerning symptoms.     Emilia Beck, PA-C 05/03/14 1517

## 2014-05-03 NOTE — ED Notes (Signed)
Patient reports that he was seen by his PCP and was sent to the ED for reoccurence of c. Diff. Patient reports that he has been hospitalized for c. diff twice since March. Patient states he last had Vancomycin 2 weeks ago. Patient denies any blood in his diarrhea.

## 2014-05-04 LAB — URINE CULTURE
Colony Count: NO GROWTH
Culture: NO GROWTH

## 2014-05-04 LAB — CLOSTRIDIUM DIFFICILE BY PCR: Toxigenic C. Difficile by PCR: POSITIVE — AB

## 2014-05-04 NOTE — ED Provider Notes (Signed)
Medical screening examination/treatment/procedure(s) were conducted as a shared visit with non-physician practitioner(s) and myself.  I personally evaluated the patient during the encounter.   EKG Interpretation None     58yM with diarrhea. Hx of recent c-diff. Reports diarrhea now has same characteristic smell. Recent urologic procedure and had perioperative abx. Suspect may precipitated reoccurrence. No significant metabolic derangement. HD stable. Afebrile. Stool studies sent. Will place back on vancomycin pending these results.    Raeford RazorStephen Nyomie Ehrlich, MD 05/04/14 [phone]

## 2014-05-06 ENCOUNTER — Telehealth (HOSPITAL_BASED_OUTPATIENT_CLINIC_OR_DEPARTMENT_OTHER): Payer: Self-pay | Admitting: Emergency Medicine

## 2014-05-06 NOTE — Telephone Encounter (Signed)
+  C. Diff. Patient was treated with Vancomycin.

## 2014-05-07 LAB — STOOL CULTURE: SPECIAL REQUESTS: NORMAL

## 2014-05-08 NOTE — Telephone Encounter (Signed)
+   c-diff. Patient notified after ID verified. Pt states that he has followed up with GI and is continuing to take RX Vancomycin.

## 2014-07-24 ENCOUNTER — Encounter (HOSPITAL_COMMUNITY): Payer: Self-pay | Admitting: Emergency Medicine

## 2014-07-24 ENCOUNTER — Emergency Department (HOSPITAL_COMMUNITY)
Admission: EM | Admit: 2014-07-24 | Discharge: 2014-07-24 | Disposition: A | Discharge status: Home / Self Care | Payer: BC Managed Care – PPO | Attending: Emergency Medicine | Admitting: Emergency Medicine

## 2014-07-24 DIAGNOSIS — Z87442 Personal history of urinary calculi: Secondary | ICD-10-CM | POA: Insufficient documentation

## 2014-07-24 DIAGNOSIS — F3289 Other specified depressive episodes: Secondary | ICD-10-CM | POA: Insufficient documentation

## 2014-07-24 DIAGNOSIS — R197 Diarrhea, unspecified: Secondary | ICD-10-CM | POA: Insufficient documentation

## 2014-07-24 DIAGNOSIS — A0472 Enterocolitis due to Clostridium difficile, not specified as recurrent: Secondary | ICD-10-CM | POA: Insufficient documentation

## 2014-07-24 DIAGNOSIS — Z79899 Other long term (current) drug therapy: Secondary | ICD-10-CM | POA: Insufficient documentation

## 2014-07-24 DIAGNOSIS — Z87891 Personal history of nicotine dependence: Secondary | ICD-10-CM | POA: Insufficient documentation

## 2014-07-24 DIAGNOSIS — I10 Essential (primary) hypertension: Secondary | ICD-10-CM | POA: Insufficient documentation

## 2014-07-24 DIAGNOSIS — F329 Major depressive disorder, single episode, unspecified: Secondary | ICD-10-CM | POA: Insufficient documentation

## 2014-07-24 LAB — CBC WITH DIFFERENTIAL/PLATELET
Basophils Absolute: 0.1 10*3/uL (ref 0.0–0.1)
Basophils Relative: 1 % (ref 0–1)
Eosinophils Absolute: 0.1 10*3/uL (ref 0.0–0.7)
Eosinophils Relative: 1 % (ref 0–5)
HCT: 43.4 % (ref 39.0–52.0)
Hemoglobin: 14.8 g/dL (ref 13.0–17.0)
LYMPHS ABS: 2 10*3/uL (ref 0.7–4.0)
LYMPHS PCT: 27 % (ref 12–46)
MCH: 28.1 pg (ref 26.0–34.0)
MCHC: 34.1 g/dL (ref 30.0–36.0)
MCV: 82.4 fL (ref 78.0–100.0)
Monocytes Absolute: 0.8 10*3/uL (ref 0.1–1.0)
Monocytes Relative: 11 % (ref 3–12)
NEUTROS ABS: 4.6 10*3/uL (ref 1.7–7.7)
NEUTROS PCT: 60 % (ref 43–77)
PLATELETS: 172 10*3/uL (ref 150–400)
RBC: 5.27 MIL/uL (ref 4.22–5.81)
RDW: 14.5 % (ref 11.5–15.5)
WBC: 7.6 10*3/uL (ref 4.0–10.5)

## 2014-07-24 LAB — BASIC METABOLIC PANEL
Anion gap: 14 (ref 5–15)
BUN: 10 mg/dL (ref 6–23)
CO2: 28 meq/L (ref 19–32)
Calcium: 9.4 mg/dL (ref 8.4–10.5)
Chloride: 101 mEq/L (ref 96–112)
Creatinine, Ser: 0.8 mg/dL (ref 0.50–1.35)
GFR calc Af Amer: >90 mL/min (ref >90)
GFR calc non Af Amer: >90 mL/min (ref >90)
Glucose, Bld: 110 mg/dL — ABNORMAL HIGH (ref 70–99)
POTASSIUM: 3.2 meq/L — AB (ref 3.7–5.3)
SODIUM: 143 meq/L (ref 137–147)

## 2014-07-24 LAB — CLOSTRIDIUM DIFFICILE BY PCR: CDIFFPCR: POSITIVE — AB

## 2014-07-24 MED ORDER — VANCOMYCIN HCL 250 MG PO CAPS: 250.0000 mg | ORAL_CAPSULE | Freq: Four times a day (QID) | ORAL | Status: DC

## 2014-07-24 MED ORDER — FIDAXOMICIN 200 MG PO TABS
200.0000 mg | ORAL_TABLET | Freq: Once | ORAL | Status: AC
  Administered 2014-07-24: 200 mg via ORAL
  Filled 2014-07-24: qty 1

## 2014-07-24 MED ORDER — FIDAXOMICIN 200 MG PO TABS: 200.0000 mg | ORAL_TABLET | Freq: Two times a day (BID) | ORAL | Status: DC

## 2014-07-24 MED ORDER — VANCOMYCIN 50 MG/ML ORAL SOLUTION: 250.0000 mg | Freq: Four times a day (QID) | ORAL | Status: DC

## 2014-07-24 MED ORDER — SODIUM CHLORIDE 0.9 % IV BOLUS (SEPSIS)
1000.0000 mL | Freq: Once | INTRAVENOUS | Status: AC
  Administered 2014-07-24: 1000 mL via INTRAVENOUS

## 2014-07-24 NOTE — ED Provider Notes (Signed)
CSN: [id number]     Arrival date & time 07/24/14  1100 History   First MD Initiated Contact with Patient 07/24/14 1128     Chief Complaint  Patient presents with  . Diarrhea     (Consider location/radiation/quality/duration/timing/severity/associated sxs/prior Treatment) HPI Comments: 58 year old male with history of kidney stone, C. difficile, high blood pressure presents with recurrent diarrhea since yesterday. This is similar to his C. difficile history and this is the fourth episode. Patient is been on both Flagyl and vancomycin for which he completed, on average every one to 2 weeks since he finished his antibiotics he has recurrence. No fevers chills or significant abdominal pain. No blood in stools. Patient tolerating oral.  Patient is a 58 y.o. male presenting with diarrhea. The history is provided by the patient.  Diarrhea Associated symptoms: no abdominal pain, no chills, no fever, no headaches and no vomiting     Past Medical History  Diagnosis Date  . Hypertension   . Depression   . Kidney stones   . C. difficile colitis 04-27-14    recent antibiotic use x2 rounds last taken 2 weeks ago, no symptoms in 2-3 weeks  . Abnormal gait 04-27-14    "limping gait since bout with C. difficille"   Past Surgical History  Procedure Laterality Date  . Lithotripsy    . No past surgeries    . Cystoscopy with retrograde pyelogram, ureteroscopy and stent placement Bilateral 04/28/2014    Procedure: CYSTOSCOPY WITH BILATERAL RETROGRADE PYELOGRAM;  Surgeon: Sebastian Ache, MD;  Location: [facility];  Service: Urology;  Laterality: Bilateral;  . Holmium laser application Left 04/28/2014    Procedure: HOLMIUM LASER APPLICATION;  Surgeon: Sebastian Ache, MD;  Location: [facility];  Service: Urology;  Laterality: Left;  . Cystoscopy with ureteroscopy and stent placement Left 04/28/2014    Procedure:  URETEROSCOPY AND STENT PLACEMENT;  Surgeon: Sebastian Ache, MD;  Location: [facility];  Service: Urology;   Laterality: Left;   Family History  Problem Relation Age of Onset  . Cancer - Prostate Father    History  Substance Use Topics  . Smoking status: Former Smoker    Quit date: 12/17/1984  . Smokeless tobacco: Never Used  . Alcohol Use: Yes     Comment: Socially    Review of Systems  Constitutional: Negative for fever and chills.  HENT: Negative for congestion.   Eyes: Negative for visual disturbance.  Respiratory: Negative for shortness of breath.   Cardiovascular: Negative for chest pain.  Gastrointestinal: Positive for diarrhea. Negative for vomiting, abdominal pain and blood in stool.  Genitourinary: Negative for dysuria and flank pain.  Musculoskeletal: Negative for back pain, neck pain and neck stiffness.  Skin: Negative for rash.  Neurological: Negative for light-headedness and headaches.      Allergies  No known allergies  Home Medications   Prior to Admission medications   Medication Sig Start Date End Date Taking? Authorizing Provider  acidophilus (RISAQUAD) CAPS capsule Take 1 capsule by mouth daily.    Historical Provider, MD  allopurinol (ZYLOPRIM) 100 MG tablet Take 100 mg by mouth daily.    Historical Provider, MD  ALPRAZolam Prudy Feeler) 0.25 MG tablet Take 0.25 mg by mouth at bedtime as needed for sleep.    Historical Provider, MD  busPIRone (BUSPAR) 15 MG tablet Take 15 mg by mouth every morning.     Historical Provider, MD  escitalopram (LEXAPRO) 20 MG tablet Take 20 mg by mouth every morning.     Historical Provider,  MD  indomethacin (INDOCIN) 50 MG capsule Take 50 mg by mouth 3 (three) times daily as needed for mild pain.    Historical Provider, MD  losartan (COZAAR) 100 MG tablet Take 100 mg by mouth every morning.     Historical Provider, MD  oxybutynin (DITROPAN) 5 MG tablet Take 1 tablet (5 mg total) by mouth every 8 (eight) hours as needed for bladder spasms. / stent discomfort 04/28/14   Sebastian Acheheodore Manny, MD  oxyCODONE-acetaminophen (ROXICET) 5-325 MG per  tablet Take 1-2 tablets by mouth every 6 (six) hours as needed for moderate pain or severe pain. Post-operatively 04/28/14   Sebastian Acheheodore Manny, MD  senna-docusate (SENOKOT-S) 8.6-50 MG per tablet Take 1 tablet by mouth 2 (two) times daily. While taking pain meds to prevent constipation 04/28/14   Sebastian Acheheodore Manny, MD  sulfamethoxazole-trimethoprim (BACTRIM DS) 800-160 MG per tablet Take 1 tablet by mouth 2 (two) times daily. X 3 days. Begin day prior to next Urology appointment 04/28/14   Sebastian Acheheodore Manny, MD  Tetrahydrozoline HCl (VISINE OP) Place 1 drop into both eyes 3 (three) times daily as needed (dry eyes).     Historical Provider, MD  vancomycin (VANCOCIN) 50 mg/mL oral solution Take 2.5 mLs (125 mg total) by mouth once. 05/03/14   Kaitlyn Szekalski, PA-C   BP 161/78  Pulse 70  Temp(Src) 97.8 F (36.6 C) (Oral)  Resp 18  Ht 5\' 7"  (1.702 m)  Wt 220 lb 1 oz (99.82 kg)  BMI 34.46 kg/m2  SpO2 97% Physical Exam  Nursing note and vitals reviewed. Constitutional: He is oriented to person, place, and time. He appears well-developed and well-nourished.  HENT:  Head: Normocephalic and atraumatic.  Mild dry mucous membranes  Eyes: Conjunctivae are normal. Right eye exhibits no discharge. Left eye exhibits no discharge.  Neck: Normal range of motion. Neck supple. No tracheal deviation present.  Cardiovascular: Normal rate and regular rhythm.   Pulmonary/Chest: Effort normal and breath sounds normal.  Abdominal: Soft. He exhibits no distension. There is no tenderness. There is no guarding.  Musculoskeletal: He exhibits no edema.  Neurological: He is alert and oriented to person, place, and time.  Skin: Skin is warm. No rash noted.  Psychiatric: He has a normal mood and affect.    ED Course  Procedures (including critical care time) Labs Review Labs Reviewed  CLOSTRIDIUM DIFFICILE BY PCR - Abnormal; Notable for the following:    C difficile by pcr POSITIVE (*)    All other components within  normal limits  BASIC METABOLIC PANEL - Abnormal; Notable for the following:    Potassium 3.2 (*)    Glucose, Bld 110 (*)    All other components within normal limits  CBC WITH DIFFERENTIAL    Imaging Review No results found.   EKG Interpretation None      MDM   Final diagnoses:  C. difficile colitis  Diarrhea   Patient with recurrent C. difficile history, well-appearing in ER, plan for blood work, IV fluids and observation.  C. difficile precautions in ER. Paged GI to discuss next treatment as patient started taking Flagyl and vancomycin, patient has not had a stool transplant this time.  Discussed the case with GI on call who recommended dificid. Patient comfortable on recheck and I discussed close followup with either his previous gastroenterologist or the gastroenterologist on call. Results and differential diagnosis were discussed with the patient/parent/guardian. Close follow up outpatient was discussed, comfortable with the plan.   Medications  sodium chloride 0.9 %  bolus 1,000 mL (0 mLs Intravenous Stopped 07/24/14 1449)  fidaxomicin (DIFICID) tablet 200 mg (200 mg Oral Given 07/24/14 1423)    Filed Vitals:   07/24/14 1330 07/24/14 1345 07/24/14 1400 07/24/14 1415  BP: 155/73 115/77 106/86 137/89  Pulse: 62 60 60 64  Temp:      TempSrc:      Resp:      Height:      Weight:      SpO2: 97% 96% 95% 97%         Enid Skeens, MD 07/24/14 1536

## 2014-07-24 NOTE — ED Notes (Signed)
Pt reports history of c-diff. Pt recently treated for c-diff in March. Last dose of Vancomycin was 2 weeks ago. Pt began having diarrhea yesterday.

## 2014-07-24 NOTE — Discharge Instructions (Signed)
Take a new antibiotic as discussed for 10 days. If your insurance, we'll not cover it take oral vancomycin 250 mg twice daily for 10 days or until you see gastroenterologist Dr. Rhea BeltonPyrtle. Take a probiotic which is over-the-counter, one example florastar 250 mg twice daily for 10 days  If you were given medicines take as directed.  If you are on coumadin or contraceptives realize their levels and effectiveness is altered by many different medicines.  If you have any reaction (rash, tongues swelling, other) to the medicines stop taking and see a physician.   Please follow up as directed and return to the ER or see a physician for new or worsening symptoms.  Thank you. Filed Vitals:   07/24/14 1118  BP: 161/78  Pulse: 70  Temp: 97.8 F (36.6 C)  TempSrc: Oral  Resp: 18  Height: 5\' 7"  (1.702 m)  Weight: 220 lb 1 oz (99.82 kg)  SpO2: 97%    Clostridium Difficile FAQs What is Clostridium difficile infection?  Clostridium difficile [pronounced Klo-STRID-ee-um dif-uh-SEEL], also known as "C. diff" [See-dif], is a germ that can cause diarrhea. Most cases of C. diff infection occur in patients taking antibiotics. The most common symptoms of a C. diff infection include:  Watery diarrhea  Fever  Loss of appetite  Nausea  Belly pain and tenderness Who is most likely to get C. diff infection? The elderly and people with certain medical problems have the greatest chance of getting C. diff. C. diff spores can live outside the human body for a very long time and may be found on things in the environment such as bed linens, bed rails, bathroom fixtures, and medical equipment. C. diff infection can spread from person-to-person on contaminated equipment and on the hands of doctors, nurses, other healthcare providers and visitors. Can C. diff infection be treated? Yes, there are antibiotics that can be used to treat C. diff. In some severe cases, a person might have to have surgery to remove the infected  part of the intestines. This surgery is needed in only 1 or 2 out of every 100 persons with C. diff. What are some of the things that hospitals are doing to prevent C. diff infections? To prevent C. diff infections, doctors, nurses, and other healthcare providers:  Clean their hands with soap and water or an alcohol-based hand rub before and after caring for every patient. This can prevent C. diff and other germs from being passed from one patient to another on their hands.  Carefully clean hospital rooms and medical equipment that have been used for patients with C. diff.  Use Contact Precautions to prevent C. diff from spreading to other patients. Contact Precautions mean:  Whenever possible, patients with C. diff will have a single room or share a room only with someone else who also has C. diff.  Healthcare providers will put on gloves and wear a gown over their clothing while taking care of patients with C. diff.  Visitors may also be asked to wear a gown and gloves.  When leaving the room, hospital providers and visitors remove their gown and gloves and clean their hands.  Patients on Contact Precautions are asked to stay in their hospital rooms as much as possible. They should not go to common areas, such as the gift shop or cafeteria. They can go to other areas of the hospital for treatments and tests.  Only give patients antibiotics when it is necessary. What can I do to help prevent C.  diff infections?  Make sure that all doctors, nurses, and other healthcare providers clean their hands with soap and water or an alcohol-based hand rub before and after caring for you.  If you do not see your providers clean their hands, please ask them to do so.  Only take antibiotics as prescribed by your doctor.  Be sure to clean your own hands often, especially after using the bathroom and before eating. Can my friends and family get C. diff when they visit me? C. diff infection usually  does not occur in persons who are not taking antibiotics. Visitors are not likely to get C. diff. Still, to make it safer for visitors, they should:  Clean their hands before they enter your room and as they leave your room  Ask the nurse if they need to wear protective gowns and gloves when they visit you. What do I need to do when I go home from the hospital? Once you are back at home, you can return to your normal routine. Often, the diarrhea will be better or completely gone before you go home. This makes giving C. diff to other people much less likely. There are a few things you should do, however, to lower the chances of developing C. diff infection again or of spreading it to others.  If you are given a prescription to treat C. diff, take the medicine exactly as prescribed by your doctor and pharmacist. Do not take half-doses or stop before you run out.  Wash your hands often, especially after going to the bathroom and before preparing food.  People who live with you should wash their hands often as well.  If you develop more diarrhea after you get home, tell your doctor immediately.  Your doctor may give you additional instructions. If you have questions, please ask your doctor or nurse. Developed and co-sponsored by [employer] for [employer] [place] [phone]); Infectious Diseases Society of America (IDSA); [facility], The Association; Association for Professionals in Infection Control and Epidemiology (APIC); Centers for Disease Control and Prevention (CDC); and [employer] Document Released: 12/08/2013 Document Reviewed: 12/08/2013 [facility] Patient Information 2015 [place], [place]. This information is not intended to replace advice given to you by your health care provider. Make sure you discuss any questions you have with your health care provider.

## 2014-08-16 ENCOUNTER — Encounter: Payer: Self-pay | Admitting: Internal Medicine

## 2014-08-16 ENCOUNTER — Ambulatory Visit (INDEPENDENT_AMBULATORY_CARE_PROVIDER_SITE_OTHER): Payer: BC Managed Care – PPO | Admitting: Internal Medicine

## 2014-08-16 VITALS — BP 156/83 | HR 58 | Temp 98.8°F | Wt 220.0 lb

## 2014-08-16 DIAGNOSIS — A498 Other bacterial infections of unspecified site: Secondary | ICD-10-CM

## 2014-08-16 DIAGNOSIS — A499 Bacterial infection, unspecified: Secondary | ICD-10-CM

## 2014-08-16 DIAGNOSIS — B9689 Other specified bacterial agents as the cause of diseases classified elsewhere: Secondary | ICD-10-CM

## 2014-08-16 MED ORDER — FIDAXOMICIN 200 MG PO TABS: 200.0000 mg | ORAL_TABLET | Freq: Two times a day (BID) | ORAL | Status: DC

## 2014-08-16 NOTE — Progress Notes (Signed)
Subjective:    Patient ID: [name] [name], male    DOB: 11-07-1956, 58 y.o.   MRN: [id number]  HPI 58yo M with recurrent c.difficile infection.  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 No recent antibiotics or in the last few years.  First episode occurred in march 2015. His diarrheal episodes led him to be dehydrate, aki, fever, colitis admitted, and received 14 day [name] of metronidazole. He reports his diarrhea improved but he had still some loose stool no smell however he noted that in the 7 days of being off of metronidazole he started tot havechills, fever, wbc 31K, got readmitted but now treated for vancomycin  QID x 14 but also noted ankle and feet swelling. He then had another week of free of symptoms, then relapse. He saw Dr. Bosie Clos, Then placed on vancomycin taper for a period of 8 wks. He was 2-3x of soft stool but no cramping no blood still. Most recently, He started to notice worsening smell of stool, loose and mucosy. He rested positive for c.difficile. He was seen then placed on rifaxamin day #4. He previously was a formed stool a day prior to getting cdifficile. Interesting he had ankle/feet swelling that then resolved after oral vancomycin stopped.  Hospitalized twice for cdifficile  Has had 2 negative test, then followed by repeat testing was +.   No history of colonoscopy. No family history of colon ca.  No recent antibiotic exposure, on SSRI, no PPI  Allergies  Allergen Reactions  . No Known Allergies    Current Outpatient Prescriptions on File Prior to Visit  Medication Sig Dispense Refill  . ALPRAZolam (XANAX) 0.25 MG tablet Take 0.25 mg by mouth at bedtime as needed for sleep.      . busPIRone (BUSPAR) 15 MG tablet Take 15 mg by mouth every morning.       . escitalopram (LEXAPRO) 20 MG tablet Take 20 mg by mouth every morning.       Marland Kitchen  losartan (COZAAR) 100 MG tablet Take 100 mg by mouth every morning.       Marland Kitchen acidophilus (RISAQUAD) CAPS capsule Take 1 capsule by mouth daily.      Marland Kitchen allopurinol (ZYLOPRIM) 100 MG tablet Take 100 mg by mouth daily.      . fidaxomicin (DIFICID) 200 MG TABS tablet Take 1 tablet (200 mg total) by mouth 2 (two) times daily.  20 tablet  0  . indomethacin (INDOCIN) 50 MG capsule Take 50 mg by mouth 3 (three) times daily as needed for mild pain.      Marland Kitchen oxybutynin (DITROPAN) 5 MG tablet Take 1 tablet (5 mg total) by mouth every 8 (eight) hours as needed for bladder spasms. / stent discomfort  30 tablet  1  . oxyCODONE-acetaminophen (ROXICET) 5-325 MG per tablet Take 1-2 tablets by mouth every 6 (six) hours as needed for moderate pain or severe pain. Post-operatively  30 tablet  0  . senna-docusate (SENOKOT-S) 8.6-50 MG per tablet Take 1 tablet by mouth 2 (two) times daily. While taking pain meds to prevent constipation  30 tablet  0  . sulfamethoxazole-trimethoprim (BACTRIM DS) 800-160 MG per tablet Take 1 tablet by mouth 2 (two) times daily. X 3 days. Begin day prior to next Urology appointment  6 tablet  0  . Tetrahydrozoline HCl (VISINE OP) Place 1 drop into both eyes 3 (three) times  daily as needed (dry eyes).       . vancomycin (VANCOCIN) 50 mg/mL oral solution Take 2.5 mLs (125 mg total) by mouth once.  140 mL  0  . vancomycin (VANCOCIN) 50 mg/mL oral solution Take 5 mLs (250 mg total) by mouth every 6 (six) hours.  200 mL  0   No current facility-administered medications on file prior to visit.   Active Ambulatory Problems    Diagnosis Date Noted  . Diarrhea 03/14/2014  . Kidney stone 03/14/2014  . Hypokalemia 03/14/2014  . Dehydration 03/14/2014  . Hypertension 03/15/2014  . C. difficile colitis 03/15/2014  . Leukocytosis 04/03/2014  . Gout attack 04/03/2014   Resolved Ambulatory Problems    Diagnosis Date Noted  . No Resolved Ambulatory Problems   Past Medical History  Diagnosis Date    . Depression   . Kidney stones   . Abnormal gait 04-27-14   History  Substance Use Topics  . Smoking status: Former Smoker    Quit date: 12/17/1984  . Smokeless tobacco: Never Used  . 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 His wife is a Engineer, civil (consulting).  family history includes Cancer - Prostate in his father.  Review of Systems Review of Systems  Constitutional: Negative for fever, chills, diaphoresis, activity change, appetite change, fatigue and unexpected weight change.  HENT: Negative for congestion, sore throat, rhinorrhea, sneezing, trouble swallowing and sinus pressure.  Eyes: Negative for photophobia and visual disturbance.  Respiratory: Negative for cough, chest tightness, shortness of breath, wheezing and stridor.  Cardiovascular: Negative for chest pain, palpitations and leg swelling.  Gastrointestinal: Negative for nausea, vomiting, abdominal pain, diarrhea, constipation, blood in stool, abdominal distention and anal bleeding.  Genitourinary: Negative for dysuria, hematuria, flank pain and difficulty urinating.  Musculoskeletal: Negative for myalgias, back pain, joint swelling, arthralgias and gait problem.  Skin: Negative for color change, pallor, rash and wound.  Neurological: Negative for dizziness, tremors, weakness and light-headedness.  Hematological: Negative for adenopathy. Does not bruise/bleed easily.  Psychiatric/Behavioral: Negative for behavioral problems, confusion, sleep disturbance, dysphoric mood, decreased concentration and agitation.       Objective:   Physical Exam BP 156/83  Pulse 58  Temp(Src) 98.8 F (37.1 C) (Oral)  Wt 220 lb (99.791 kg) Physical Exam  Constitutional: He is oriented to person, place, and time. He appears well-developed and well-nourished. No distress.  HENT:  Mouth/Throat: Oropharynx is clear and moist. No oropharyngeal exudate.  Cardiovascular: Normal rate,  regular rhythm and normal heart sounds. Exam reveals no gallop and no friction rub.  No murmur heard.  Pulmonary/Chest: Effort normal and breath sounds normal. No respiratory distress. He has no wheezes.  Abdominal: + diarrhea per hpi Lymphadenopathy:  He has no cervical adenopathy.  Neurological: He is alert and oriented to person, place, and time.  Skin: Skin is warm and dry. No rash noted. No erythema.  Psychiatric: He has a normal mood and affect. His behavior is normal.        Assessment & Plan:  Refractory c.difficile = he initially describes a prolonged [name] but ultimately had  documented negative c.difficile pcr. Most recently appears that he may have had relapse with repeat testing being positive. He has had  Exposure to 1 trial of metronidazole, 2 trials of oral vancomycin, plus a prolonged taper over 6 wks. He does respond to oral vancomycin however has peripheral edema side effect. We will  Refer to Dr. Leone Payor for evaluation for FMT if dificid does not work.  Will have him continue rifaximin for now for 10days, and switch when he gets approval for fidoxamicin. Discussed possibility for having fecal transplant. He would like to try fidoxamicin but open to fecal transplant.   Vancomycin side effect of lower extremity edema = unusual side effect that i have not seen before but appears to have occur frequently with repeat exposure to oral vancomycin.   Patient to call back at end of fidoxamicin [name] to see how he is doing and decide at that point if need to try 2nd trial of fidoxamicin vs. A taper with rifaxamin vs.  Proceeding to FMT.  rtc in  3-4wks  Addendum: patient started fidoxamicin on 08/18/14

## 2014-08-17 ENCOUNTER — Telehealth: Payer: Self-pay | Admitting: Licensed Clinical Social Worker

## 2014-08-17 NOTE — Telephone Encounter (Signed)
Patient wanted Dr. Drue Second to know that he just started taking dificile today, insurance approved it.

## 2014-08-17 NOTE — Telephone Encounter (Signed)
Yes start dificid today

## 2014-09-14 ENCOUNTER — Encounter: Payer: Self-pay | Admitting: Internal Medicine

## 2014-09-14 ENCOUNTER — Ambulatory Visit (INDEPENDENT_AMBULATORY_CARE_PROVIDER_SITE_OTHER): Payer: BC Managed Care – PPO | Admitting: Internal Medicine

## 2014-09-14 VITALS — BP 174/101 | HR 91 | Temp 98.2°F | Wt 222.0 lb

## 2014-09-14 DIAGNOSIS — R413 Other amnesia: Secondary | ICD-10-CM | POA: Diagnosis not present

## 2014-09-14 DIAGNOSIS — R5383 Other fatigue: Secondary | ICD-10-CM

## 2014-09-14 DIAGNOSIS — A0472 Enterocolitis due to Clostridium difficile, not specified as recurrent: Secondary | ICD-10-CM | POA: Diagnosis not present

## 2014-09-14 DIAGNOSIS — R5381 Other malaise: Secondary | ICD-10-CM | POA: Diagnosis not present

## 2014-09-14 DIAGNOSIS — I10 Essential (primary) hypertension: Secondary | ICD-10-CM

## 2014-09-14 DIAGNOSIS — A0471 Enterocolitis due to Clostridium difficile, recurrent: Secondary | ICD-10-CM

## 2014-09-14 MED ORDER — SACCHAROMYCES BOULARDII 250 MG PO CAPS: 250.0000 mg | ORAL_CAPSULE | Freq: Two times a day (BID) | ORAL | Status: AC

## 2014-09-14 NOTE — Progress Notes (Signed)
Subjective:    Patient ID: [name] [name], male    DOB: [birth year], 58 y.o.   MRN: [id number]  HPI 58yo M with recurrent cdi with intolerance to oral vancomycin has finished taking 10 day course of fidoxamicin roughly 2-3 wk ago. He is only reporting have 2 loose stools per day, no blood in stool. Still has foul smell. Would like to consider having fecal transplant. 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   Recently started on cymbalta for decreased mood from his repeated hospitalizations. He still feel that he has cognitive slowing since being ill.  His wife likely to be the stool donor.  Allergies  Allergen Reactions  . No Known Allergies    Current Outpatient Prescriptions on File Prior to Visit  Medication Sig Dispense Refill  . acidophilus (RISAQUAD) CAPS capsule Take 1 capsule by mouth daily.      Marland Kitchen ALPRAZolam (XANAX) 0.25 MG tablet Take 0.25 mg by mouth at bedtime as needed for sleep.      . hydrochlorothiazide (HYDRODIURIL) 25 MG tablet Take 25 mg by mouth daily.      . indomethacin (INDOCIN) 50 MG capsule Take 50 mg by mouth 3 (three) times daily as needed for mild pain.      Marland Kitchen losartan (COZAAR) 100 MG tablet Take 100 mg by mouth every morning.       Marland Kitchen oxybutynin (DITROPAN) 5 MG tablet Take 1 tablet (5 mg total) by mouth every 8 (eight) hours as needed for bladder spasms. / stent discomfort  30 tablet  1  . allopurinol (ZYLOPRIM) 100 MG tablet Take 100 mg by mouth daily.      . busPIRone (BUSPAR) 15 MG tablet Take 15 mg by mouth every morning.       . escitalopram (LEXAPRO) 20 MG tablet Take 20 mg by mouth every morning.       . fidaxomicin (DIFICID) 200 MG TABS tablet Take 1 tablet (200 mg total) by mouth 2 (two) times daily.  20 tablet  1  . oxyCODONE-acetaminophen (ROXICET) 5-325 MG per tablet Take 1-2 tablets by mouth every 6 (six) hours as needed for moderate pain or severe pain.  Post-operatively  30 tablet  0  . rifaximin (XIFAXAN) 200 MG tablet Take 200 mg by mouth 3 (three) times daily.      Marland Kitchen senna-docusate (SENOKOT-S) 8.6-50 MG per tablet Take 1 tablet by mouth 2 (two) times daily. While taking pain meds to prevent constipation  30 tablet  0  . sulfamethoxazole-trimethoprim (BACTRIM DS) 800-160 MG per tablet Take 1 tablet by mouth 2 (two) times daily. X 3 days. Begin day prior to next Urology appointment  6 tablet  0  . Tetrahydrozoline HCl (VISINE OP) Place 1 drop into both eyes 3 (three) times daily as needed (dry eyes).       . vancomycin (VANCOCIN) 50 mg/mL oral solution Take 2.5 mLs (125 mg total) by mouth once.  140 mL  0  . vancomycin (VANCOCIN) 50 mg/mL oral solution Take 5 mLs (250 mg total) by mouth every 6 (six) hours.  200 mL  0   No current facility-administered medications on file prior to visit.   Active Ambulatory Problems    Diagnosis Date Noted  . Diarrhea 03/14/2014  . Kidney stone 03/14/2014  . Hypokalemia 03/14/2014  . Dehydration 03/14/2014  . Hypertension 03/15/2014  . C.  difficile colitis 03/15/2014  . Leukocytosis 04/03/2014  . Gout attack 04/03/2014   Resolved Ambulatory Problems    Diagnosis Date Noted  . No Resolved Ambulatory Problems   Past Medical History  Diagnosis Date  . Depression   . Kidney stones   . Abnormal gait 04-27-14      Review of Systems + loose stool. Fatigue. Memory difficulties    Objective:   Physical Exam BP 174/101  Pulse 91  Temp(Src) 98.2 F (36.8 C) (Oral)  Wt 222 lb (100.699 kg) No exam      Assessment & Plan:  Recurrent cdifficile = has just finished course of therapy. We will have him take home a kit and let us know if he has watery stools of 4-5 episodes occur again. Discussed criteria of stool donor with this wife. Since still having loose stool x 2 per day, will continue him on florastor.  - will have him see Dr. Carlean Purl to evaluate for FMT - will provide work letter  - if  anticipate FMT, then will test wife. - if he does have FMT, we will do protocol with fidoxamicin instead of oral vanco  htn = poorly controlled recommend to follow up with pcp to see if he needs addn agent  Fatigue/malaise = likely related to recurrent cdi  Cognitive delay/memory difficulties = illness can often unmask underlying cognitive impairment. He might benefit from going to neuropsychologist for evaluation of neurocognitive impairment  Spent 35 min with patient with greater than 50% in coordination of care, and counseling

## 2014-09-15 ENCOUNTER — Telehealth: Payer: Self-pay

## 2014-09-15 NOTE — Telephone Encounter (Signed)
Message copied by Annett FabianJONES, Azelea Seguin L on Wed Sep 15, 2014 10:03 AM ------      Message from: Iva BoopGESSNER, CARL E      Created: Tue Sep 14, 2014 10:11 PM       We will get him in in 1-2 weeks      ----- Message -----         From: Judyann Munsonynthia Snider, MD         Sent: 09/14/2014   3:16 PM           To: Iva Booparl E Gessner, MD            Can you see this guy also for FMT. I have another (3rd) patient to send your way. Mr. [name] [name],abit interesting, since he was intolerant of oral vanco.        ------

## 2014-09-15 NOTE — Telephone Encounter (Signed)
Patient scheduled for 09/22/14 9:15 Left message for patient to call back

## 2014-09-17 NOTE — Telephone Encounter (Signed)
Left message for patient to call back  

## 2014-09-20 NOTE — Telephone Encounter (Signed)
Attempted to call again mailbox is full, no answer

## 2014-09-20 NOTE — Telephone Encounter (Signed)
Mail box is full. I am unable to reach the patient about the appointment with Dr. Leone PayorGessner on 10/7

## 2014-09-21 NOTE — Telephone Encounter (Signed)
Attempted to call again mailbox is full.  Unable to reach the patient

## 2014-09-22 ENCOUNTER — Encounter: Payer: Self-pay | Admitting: Internal Medicine

## 2014-09-22 ENCOUNTER — Other Ambulatory Visit: Payer: Worker's Compensation

## 2014-09-22 ENCOUNTER — Ambulatory Visit (INDEPENDENT_AMBULATORY_CARE_PROVIDER_SITE_OTHER): Payer: BLUE CROSS/BLUE SHIELD | Admitting: Internal Medicine

## 2014-09-22 VITALS — BP 158/78 | HR 68 | Ht 66.75 in | Wt 223.2 lb

## 2014-09-22 DIAGNOSIS — M25471 Effusion, right ankle: Secondary | ICD-10-CM

## 2014-09-22 DIAGNOSIS — M25472 Effusion, left ankle: Secondary | ICD-10-CM | POA: Diagnosis not present

## 2014-09-22 DIAGNOSIS — A047 Enterocolitis due to Clostridium difficile: Secondary | ICD-10-CM | POA: Diagnosis not present

## 2014-09-22 DIAGNOSIS — A0471 Enterocolitis due to Clostridium difficile, recurrent: Secondary | ICD-10-CM

## 2014-09-22 DIAGNOSIS — Z1211 Encounter for screening for malignant neoplasm of colon: Secondary | ICD-10-CM

## 2014-09-22 HISTORY — DX: Effusion, right ankle: M25.471

## 2014-09-22 NOTE — Progress Notes (Addendum)
Subjective:    Patient ID: [name] [name], male    DOB: 14-Sep-1956, 58 y.o.   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 He has had recurrent episodes of Clostridium difficile diarrhea as outlined in the chart. He has been hospitalized. He started out with metronidazole and changed to  vancomycin. Vancomycin will help the diarrhea but he develops severe ankle swelling that is painful when he takes that. He has had a negative C. difficile toxin but not a negative PCR. He has never had a screening colonoscopy. He is having one to 2 soft stools with some mucus now, having recently completed a course of fidaxomicin He is not able to return to work until he has 2 negative consecutive C diff PCR tests. Allergies  Allergen Reactions  . No Known Allergies    Outpatient Prescriptions Prior to Visit  Medication Sig Dispense Refill  . acidophilus (RISAQUAD) CAPS capsule Take 1 capsule by mouth daily.      Marland Kitchen. ALPRAZolam (XANAX) 0.25 MG tablet Take 0.25 mg by mouth at bedtime as needed for sleep.      . DULoxetine (CYMBALTA) 60 MG capsule Take 60 mg by mouth daily.      . hydrochlorothiazide (HYDRODIURIL) 25 MG tablet Take 25 mg by mouth daily.      Marland Kitchen. losartan (COZAAR) 100 MG tablet Take 100 mg by mouth every morning.       . saccharomyces boulardii (FLORASTOR) 250 MG capsule Take 1 capsule (250 mg total) by mouth 2 (two) times daily.  30 capsule  2  . Tetrahydrozoline HCl (VISINE OP) Place 1 drop into both eyes 3 (three) times daily as needed (dry eyes).       .      . allopurinol (ZYLOPRIM) 100 MG tablet Take 100 mg by mouth daily.             Marland Kitchen. escitalopram (LEXAPRO) 20 MG tablet Take 20 mg by mouth every morning.       . indomethacin (INDOCIN) 50 MG capsule Take 50 mg by mouth 3 (three) times daily as needed for mild pain.      Marland Kitchen. oxybutynin (DITROPAN) 5 MG tablet Take 1 tablet (5  mg total) by mouth every 8 (eight) hours as needed for bladder spasms. / stent discomfort  30 tablet  1  . oxyCODONE-acetaminophen (ROXICET) 5-325 MG per tablet Take 1-2 tablets by mouth every 6 (six) hours as needed for moderate pain or severe pain. Post-operatively  30 tablet  0        . senna-docusate (SENOKOT-S) 8.6-50 MG per tablet Take 1 tablet by mouth 2 (two) times daily. While taking pain meds to prevent constipation  30 tablet  0                      Past Medical History  Diagnosis Date  . Hypertension   . Depression   . Kidney stones   . C. difficile colitis 04-27-14    recent antibiotic use x2 rounds last taken 2 weeks ago, no symptoms in 2-3 weeks  . Abnormal gait 04-27-14    "limping gait since bout with C. difficille"  . 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 Swelling of both ankles - question reaction to vancomycin versus inflammatory arthritis reaction to C. difficile 09/22/2014   Past Surgical History  Procedure Laterality Date  .  Lithotripsy    . No past surgeries    . Cystoscopy with retrograde pyelogram, ureteroscopy and stent placement Bilateral 04/28/2014    Procedure: CYSTOSCOPY WITH BILATERAL RETROGRADE PYELOGRAM;  Surgeon: Sebastian Ache, MD;  Location: [facility];  Service: Urology;  Laterality: Bilateral;  . Holmium laser application Left 04/28/2014    Procedure: HOLMIUM LASER APPLICATION;  Surgeon: Sebastian Ache, MD;  Location: [facility];  Service: Urology;  Laterality: Left;  . Cystoscopy with ureteroscopy and stent placement Left 04/28/2014    Procedure:  URETEROSCOPY AND STENT PLACEMENT;  Surgeon: Sebastian Ache, MD;  Location: [facility];  Service: Urology;  Laterality: Left;   History   Social History  . Marital Status: Married    Spouse Name: N/A    Number of Children: 3  . Years of Education: Physical Therapist   Occupational History  . Physical Therapist    Social History Main Topics  . Smoking status: Former Smoker    Quit date:  12/17/1984  . Smokeless tobacco: Never Used  . Alcohol Use: No  . Drug Use: No  . 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 Cancer - Prostate Father   . Colon cancer Neg Hx   . Colon polyps Neg Hx   . Diabetes Neg Hx   . Kidney disease Neg Hx   . Esophageal cancer Neg Hx      Review of Systems Memory disturbance, fatigue, depressed mood since recurrent illness this year All other ROS negative or per HPI    Objective:   Physical Exam General:  Well-developed, well-nourished and in no acute distress Eyes:  anicteric. ENT:   Mouth and posterior pharynx free of lesions.  Neck:   supple w/o thyromegaly or mass.  Lungs: Clear to auscultation bilaterally. Heart:  S1S2, no rubs, murmurs, gallops. Abdomen:  soft, non-tender, no hepatosplenomegaly, hernia, or mass and BS+.  Lymph:  no cervical or supraclavicular adenopathy. Extremities:   no edema Skin   no rash. Neuro:  A&O x 3.  Psych:  appropriate mood and  Affect.   Data Reviewed: ID notes, labs, hospital notes     Assessment & Plan:  Recurrent colitis due to Clostridium difficile Currently in remission it seems but still could be colonized. He may need an FMT to eradicate and be able to return to work. His situation is further complicated by the intolerance to vancomycin which may actually be an inflammatory arthritis from the C. difficile as opposed to edema from the vancomycin. It's just not clear. I ordered another C diff PCR - has never been negative, just a C diff toxin, and have told him to wait until I discuss timimg of test with Dr. Drue Second. We reviewed risks benefits and indications of colonoscopy delivered FMT.  Colon cancer screening Screening colonoscopy appropriate Holding off on scheduling in case he would need FMT so as to not have 2 colonoscopies close  together I plan to contact him in about 1 month or sooner to f/u and arrange pending symptom review and discussion with Dr. Drue Second  Swelling of both ankles - question reaction to vancomycin versus inflammatory arthritis reaction to C. difficile Not a problem now, and apparently has happened every time he is on vancomycin  I appreciate the opportunity  to care for this patient.  Cc: Maryelizabeth Rowan, MD,  Judyann Munson, MD and The Patient

## 2014-09-22 NOTE — Assessment & Plan Note (Signed)
Not a problem now, and apparently has happened every time he is on vancomycin

## 2014-09-22 NOTE — Assessment & Plan Note (Addendum)
Currently in remission it seems but still could be colonized. He may need an FMT to eradicate and be able to return to work. His situation is further complicated by the intolerance to vancomycin which may actually be an inflammatory arthritis from the C. difficile as opposed to edema from the vancomycin. It's just not clear. I ordered another C diff PCR - has never been negative, just a C diff toxin, and have told him to wait until I discuss timimg of test with Dr. Drue SecondSnider. We reviewed risks benefits and indications of colonoscopy delivered FMT.

## 2014-09-22 NOTE — Assessment & Plan Note (Signed)
Screening colonoscopy appropriate Holding off on scheduling in case he would need FMT so as to not have 2 colonoscopies close together I plan to contact him in about 1 month or sooner to f/u and arrange pending symptom review and discussion with Dr. Drue SecondSnider

## 2014-09-22 NOTE — Patient Instructions (Signed)
Your physician has requested that you go to the basement for the following lab work before leaving today: C-Diff Stool Study  I appreciate the opportunity to care for you.

## 2014-09-24 ENCOUNTER — Telehealth: Payer: Self-pay | Admitting: Internal Medicine

## 2014-09-24 NOTE — Telephone Encounter (Signed)
Spoke with patient's wife and she states she was waiting to hear from Dr. Leone PayorGessner with plans for treatment.. She thought she got a call from our office. Dr. Leone PayorGessner, did you try to reach this patient?

## 2014-09-24 NOTE — Telephone Encounter (Signed)
Need to talk to Dr. Drue SecondSnider and have not been able to connect yet Is he ok? Anticipate letting him know by next week

## 2014-09-24 NOTE — Telephone Encounter (Signed)
Spoke with patient's wife and gave her Dr. Marvell FullerGessner's information. She states her husband is doing ok. He is having the same number of stools.

## 2014-09-27 NOTE — Telephone Encounter (Signed)
Tell patient and/or wife I spoke with Dr. Drue SecondSnider  Plan is to do a C diff PCR  If + then will plan for fecal microbiotica transplant  If negative will repeat in 1 month  He needs 2 negative C diff PCR's to return to work

## 2014-09-27 NOTE — Telephone Encounter (Signed)
Left message for patient to call back  

## 2014-09-28 NOTE — Telephone Encounter (Signed)
Are we ordering c-diff or Dr. Feliz BeamSnider's office? I have left another message for the patient or his wife to call back

## 2014-09-28 NOTE — Telephone Encounter (Signed)
Discussed with Dr. Leone PayorGessner patient to come here for c-diff.  His wife states that they have the container at home and will deliver the specimen to the lab

## 2014-09-29 ENCOUNTER — Other Ambulatory Visit: Payer: PRIVATE HEALTH INSURANCE

## 2014-09-29 DIAGNOSIS — A0471 Enterocolitis due to Clostridium difficile, recurrent: Secondary | ICD-10-CM

## 2014-09-30 LAB — CLOSTRIDIUM DIFFICILE BY PCR: CDIFFPCR: NOT DETECTED

## 2014-09-30 NOTE — Progress Notes (Signed)
Quick Note:  Let him know he has cleared C diff Recheck in 1 month also - he needs 2 tests negative to return to work. If his work allows a shorter interval ok to do then. ______

## 2014-10-01 ENCOUNTER — Other Ambulatory Visit: Payer: Self-pay

## 2014-10-01 DIAGNOSIS — R197 Diarrhea, unspecified: Secondary | ICD-10-CM

## 2014-10-09 ENCOUNTER — Emergency Department (HOSPITAL_COMMUNITY)
Admission: EM | Admit: 2014-10-09 | Discharge: 2014-10-09 | Disposition: A | Discharge status: Home / Self Care | Payer: PRIVATE HEALTH INSURANCE | Attending: Emergency Medicine | Admitting: Emergency Medicine

## 2014-10-09 ENCOUNTER — Encounter (HOSPITAL_COMMUNITY): Payer: Self-pay | Admitting: Emergency Medicine

## 2014-10-09 DIAGNOSIS — I10 Essential (primary) hypertension: Secondary | ICD-10-CM | POA: Insufficient documentation

## 2014-10-09 DIAGNOSIS — F329 Major depressive disorder, single episode, unspecified: Secondary | ICD-10-CM | POA: Insufficient documentation

## 2014-10-09 DIAGNOSIS — Z87442 Personal history of urinary calculi: Secondary | ICD-10-CM | POA: Insufficient documentation

## 2014-10-09 DIAGNOSIS — R197 Diarrhea, unspecified: Secondary | ICD-10-CM

## 2014-10-09 DIAGNOSIS — Z87891 Personal history of nicotine dependence: Secondary | ICD-10-CM | POA: Insufficient documentation

## 2014-10-09 DIAGNOSIS — Z79899 Other long term (current) drug therapy: Secondary | ICD-10-CM | POA: Insufficient documentation

## 2014-10-09 DIAGNOSIS — Z8619 Personal history of other infectious and parasitic diseases: Secondary | ICD-10-CM | POA: Insufficient documentation

## 2014-10-09 LAB — CBC WITH DIFFERENTIAL/PLATELET
Basophils Absolute: 0.1 10*3/uL (ref 0.0–0.1)
Basophils Relative: 1 % (ref 0–1)
Eosinophils Absolute: 0.2 10*3/uL (ref 0.0–0.7)
Eosinophils Relative: 3 % (ref 0–5)
HCT: 45.2 % (ref 39.0–52.0)
HEMOGLOBIN: 15.8 g/dL (ref 13.0–17.0)
LYMPHS ABS: 1.9 10*3/uL (ref 0.7–4.0)
Lymphocytes Relative: 35 % (ref 12–46)
MCH: 29.2 pg (ref 26.0–34.0)
MCHC: 35 g/dL (ref 30.0–36.0)
MCV: 83.5 fL (ref 78.0–100.0)
MONOS PCT: 9 % (ref 3–12)
Monocytes Absolute: 0.5 10*3/uL (ref 0.1–1.0)
NEUTROS PCT: 52 % (ref 43–77)
Neutro Abs: 2.8 10*3/uL (ref 1.7–7.7)
Platelets: 188 10*3/uL (ref 150–400)
RBC: 5.41 MIL/uL (ref 4.22–5.81)
RDW: 15 % (ref 11.5–15.5)
WBC: 5.5 10*3/uL (ref 4.0–10.5)

## 2014-10-09 LAB — COMPREHENSIVE METABOLIC PANEL
ALBUMIN: 3.9 g/dL (ref 3.5–5.2)
ALK PHOS: 82 U/L (ref 39–117)
ALT: 46 U/L (ref 0–53)
ANION GAP: 17 — AB (ref 5–15)
AST: 38 U/L — ABNORMAL HIGH (ref 0–37)
BILIRUBIN TOTAL: 0.3 mg/dL (ref 0.3–1.2)
BUN: 9 mg/dL (ref 6–23)
CHLORIDE: 100 meq/L (ref 96–112)
CO2: 22 mEq/L (ref 19–32)
Calcium: 9.4 mg/dL (ref 8.4–10.5)
Creatinine, Ser: 0.8 mg/dL (ref 0.50–1.35)
GFR calc Af Amer: >90 mL/min (ref >90)
GFR calc non Af Amer: >90 mL/min (ref >90)
Glucose, Bld: 170 mg/dL — ABNORMAL HIGH (ref 70–99)
Potassium: 3.4 mEq/L — ABNORMAL LOW (ref 3.7–5.3)
SODIUM: 139 meq/L (ref 137–147)
Total Protein: 7.3 g/dL (ref 6.0–8.3)

## 2014-10-09 LAB — CLOSTRIDIUM DIFFICILE BY PCR: Toxigenic C. Difficile by PCR: NEGATIVE

## 2014-10-09 NOTE — ED Notes (Signed)
Diarrhea starting late yesterday afternoon; reports 5 episodes. Yellowy, mucousy in color. Reports history of CDiff this year. Denies any abd pain.

## 2014-10-09 NOTE — Discharge Instructions (Signed)
C. Diff test was negative today. Please follow-up with GI. Return to the ED for new concerns.

## 2014-10-09 NOTE — ED Provider Notes (Signed)
CSN: [id number]     Arrival date & time 10/09/14  0930 History   First MD Initiated Contact with Patient 10/09/14 [phone]     Chief Complaint  Patient presents with  . Diarrhea     (Consider location/radiation/quality/duration/timing/severity/associated sxs/prior Treatment) Patient is a 58 y.o. male presenting with diarrhea. The history is provided by the patient and medical records.  Diarrhea  There is a 58 year old male with past medical history significant for hypertension, depression, kidney stones, recurrent C. difficile colitis, presenting to the ED for diarrhea. Patient states symptoms began yesterday afternoon, since that time he has had multiple episodes of mucousy, yellow diarrhea. He states this is similar to his prior episodes of C. Difficile.  No melena or hematochezia. He denies any current abdominal pain, nausea, vomiting, fever, chills, or sweats.  He had a recent C. Difficile PCR test approximately 1 week ago that was negative.  Not currently on any antibiotics.  Patient is followed by [facility].  Some discussion of fecal transplant if continue having recurrent episodes.  VS stable on arrival.  Past Medical History  Diagnosis Date  . Hypertension   . Depression   . Kidney stones   . C. difficile colitis 04-27-14    recent antibiotic use x2 rounds last taken 2 weeks ago, no symptoms in 2-3 weeks  . Abnormal gait 04-27-14    "limping gait since bout with C. difficille"  . 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 Swelling of both ankles - question reaction to vancomycin versus inflammatory arthritis reaction to C. difficile 09/22/2014   Past Surgical History  Procedure Laterality Date  . Lithotripsy    . No past surgeries    . Cystoscopy with retrograde pyelogram, ureteroscopy and stent placement Bilateral 04/28/2014    Procedure: CYSTOSCOPY WITH BILATERAL RETROGRADE PYELOGRAM;  Surgeon: Sebastian Acheheodore Manny, MD;  Location: [facility];  Service: Urology;  Laterality:  Bilateral;  . Holmium laser application Left 04/28/2014    Procedure: HOLMIUM LASER APPLICATION;  Surgeon: Sebastian Acheheodore Manny, MD;  Location: [facility];  Service: Urology;  Laterality: Left;  . Cystoscopy with ureteroscopy and stent placement Left 04/28/2014    Procedure:  URETEROSCOPY AND STENT PLACEMENT;  Surgeon: Sebastian Acheheodore Manny, MD;  Location: [facility];  Service: Urology;  Laterality: Left;   Family History  Problem Relation Age of Onset  . Cancer - Prostate Father   . Colon cancer Neg Hx   . Colon polyps Neg Hx   . Diabetes Neg Hx   . Kidney disease Neg Hx   . Esophageal cancer Neg Hx    History  Substance Use Topics  . Smoking status: Former Smoker    Quit date: 12/17/1984  . Smokeless tobacco: Never Used  . Alcohol Use: No    Review of Systems  Gastrointestinal: Positive for diarrhea.  All other systems reviewed and are negative.     Allergies  No known allergies  Home Medications   Prior to Admission medications   Medication Sig Start Date End Date Taking? Authorizing Provider  acidophilus (RISAQUAD) CAPS capsule Take 1 capsule by mouth daily.    Historical Provider, MD  ALPRAZolam Prudy Feeler(XANAX) 0.25 MG tablet Take 0.25 mg by mouth at bedtime as needed for sleep.    Historical Provider, MD  DULoxetine (CYMBALTA) 60 MG capsule Take 60 mg by mouth daily.    Historical Provider, MD  hydrochlorothiazide (HYDRODIURIL) 25 MG tablet Take 25 mg by mouth daily.    Historical Provider, MD  losartan (COZAAR)  100 MG tablet Take 100 mg by mouth every morning.     Historical Provider, MD  saccharomyces boulardii (FLORASTOR) 250 MG capsule Take 1 capsule (250 mg total) by mouth 2 (two) times daily. 09/14/14   Judyann Munsonynthia Snider, MD  Tetrahydrozoline HCl (VISINE OP) Place 1 drop into both eyes 3 (three) times daily as needed (dry eyes).     Historical Provider, MD   BP 150/80  Pulse 79  Temp(Src) 97.6 F (36.4 C) (Oral)  Resp 18  Ht 5\' 7"  (1.702 m)  Wt 223 lb (101.152 kg)  BMI 34.92 kg/m2  SpO2  97%  Physical Exam  Nursing note and vitals reviewed. Constitutional: He is oriented to person, place, and time. He appears well-developed and well-nourished.  HENT:  Head: Normocephalic and atraumatic.  Mouth/Throat: Oropharynx is clear and moist.  Eyes: Conjunctivae and EOM are normal. Pupils are equal, round, and reactive to light.  Neck: Normal range of motion.  Cardiovascular: Normal rate, regular rhythm and normal heart sounds.   Pulmonary/Chest: Effort normal and breath sounds normal. No respiratory distress. He has no wheezes.  Abdominal: Soft. Bowel sounds are normal. There is no tenderness. There is no guarding.  Abdomen soft, non-distended, no focal tenderness or peritoneal signs  Musculoskeletal: Normal range of motion.  Neurological: He is alert and oriented to person, place, and time.  Skin: Skin is warm and dry.  Psychiatric: He has a normal mood and affect.    ED Course  Procedures (including critical care time) Labs Review Labs Reviewed  COMPREHENSIVE METABOLIC PANEL - Abnormal; Notable for the following:    Potassium 3.4 (*)    Glucose, Bld 170 (*)    AST 38 (*)    Anion gap 17 (*)    All other components within normal limits  CLOSTRIDIUM DIFFICILE BY PCR  CBC WITH DIFFERENTIAL    Imaging Review No results found.   EKG Interpretation None      MDM   Final diagnoses:  Diarrhea   Patient on-year-old male presented with diarrhea. He has history of recurrent C. difficile colitis. On exam, patient afebrile and nontoxic-appearing. His abdominal exam is benign. Lab work is reassuring. C. difficile by PCR negative. Abdominal exam remains benign. Patient will be discharged home and instructed to follow-up with his gastroenterologist.  Will not res-start abx at this time. Discussed plan with patient, he/she acknowledged understanding and agreed with plan of care.  Return precautions given for new or worsening symptoms.   Garlon HatchetLisa M Evella Kasal, PA-C 10/09/14 1610

## 2014-10-10 NOTE — ED Provider Notes (Signed)
Medical screening examination/treatment/procedure(s) were performed by non-physician practitioner and as supervising physician I was immediately available for consultation/collaboration.   EKG Interpretation None       Juliet RudeNathan R. Rubin PayorPickering, MD 10/10/14 [phone]

## 2014-10-11 ENCOUNTER — Telehealth: Payer: Self-pay | Admitting: Licensed Clinical Social Worker

## 2014-10-11 NOTE — Telephone Encounter (Signed)
Patient was seen in the ED 2 days ago with 5 or more liquid stools, and foul odor. PCR was negative, would you like the patient to start on Dificid? Patient is currently having frequent diarrhea since leaving the ED. Please advise

## 2014-10-11 NOTE — Telephone Encounter (Signed)
No dificid. He doesn't have cdiff but has post infectious diarrhea bowel like process

## 2014-10-20 ENCOUNTER — Encounter: Payer: Self-pay | Admitting: Internal Medicine

## 2014-10-20 ENCOUNTER — Ambulatory Visit (INDEPENDENT_AMBULATORY_CARE_PROVIDER_SITE_OTHER): Payer: BLUE CROSS/BLUE SHIELD | Admitting: Internal Medicine

## 2014-10-20 VITALS — BP 131/85 | HR 114 | Temp 98.1°F | Wt 217.0 lb

## 2014-10-20 DIAGNOSIS — K589 Irritable bowel syndrome without diarrhea: Secondary | ICD-10-CM

## 2014-10-20 MED ORDER — RIFAXIMIN 550 MG PO TABS: 550.0000 mg | ORAL_TABLET | Freq: Three times a day (TID) | ORAL | Status: DC

## 2014-10-20 NOTE — Progress Notes (Signed)
   Subjective:    Patient ID: [name] [name], male    DOB: Nov 04, 1956, 58 y.o.   MRN: [id number]  HPI 58yo m with recurrent cdifficile. He was last tested in mid-late October which was negative. He started to notice  an uptake in bowel frequency with foul smelling, mucousy stool went to the ED for evaluation on 10/24. Repeat cdifficile on 10/24 negative, while off of dificid. He has not been re-initiated on oral vancomycin or dificid. He is still having 4 loose bm per day. No abdominal cramping. Occ. Having sweating but no overt fevers. No change in appetite. He remains off of work, until he improves from his chronic diarrhea +/- recurrent cdi.  Stool donor if we proceed with FMT = would be their daughter in law from [place], (all their family is in [place]).  Current Outpatient Prescriptions on File Prior to Visit  Medication Sig Dispense Refill  . ALPRAZolam (XANAX) 0.25 MG tablet Take 0.25 mg by mouth at bedtime as needed for sleep.    . DULoxetine (CYMBALTA) 60 MG capsule Take 60 mg by mouth daily.    Marland Kitchen. olmesartan-hydrochlorothiazide (BENICAR HCT) 40-25 MG per tablet Take 1 tablet by mouth daily.    Marland Kitchen. saccharomyces boulardii (FLORASTOR) 250 MG capsule Take 1 capsule (250 mg total) by mouth 2 (two) times daily. 30 capsule 2   No current facility-administered medications on file prior to visit.   Active Ambulatory Problems    Diagnosis Date Noted  . Kidney stone 03/14/2014  . Hypertension 03/15/2014  . 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 Gout attack 04/03/2014  . Colon cancer screening 09/22/2014  . Swelling of both ankles - question reaction to vancomycin versus inflammatory arthritis reaction to C. difficile 09/22/2014   Resolved Ambulatory Problems    Diagnosis Date Noted  . Diarrhea 03/14/2014  . Hypokalemia 03/14/2014  . Dehydration 03/14/2014  . Leukocytosis 04/03/2014   Past Medical History  Diagnosis Date  . Depression   . Kidney stones   . C.  difficile colitis 04-27-14  . Abnormal gait 04-27-14      Review of Systems GI symptoms, otherwise 10 oint ros is negative    Objective:   Physical Exam  BP 131/85 mmHg  Pulse 114  Temp(Src) 98.1 F (36.7 C) (Oral)  Wt 217 lb (98.431 kg) Physical Exam  Constitutional: He is oriented to person, place, and time. He appears well-developed and well-nourished. No distress.  HENT:  Mouth/Throat: Oropharynx is clear and moist. No oropharyngeal exudate.  Cardiovascular: Normal rate, regular rhythm and normal heart sounds. Exam reveals no gallop and no friction rub.  No murmur heard.  Pulmonary/Chest: Effort normal and breath sounds normal. No respiratory distress. He has no wheezes.  Abdominal: Soft. Bowel sounds are normal. He exhibits no distension. There is no tenderness.  Lymphadenopathy:  He has no cervical adenopathy.  Neurological: He is alert and oriented to person, place, and time.  Skin: Skin is warm and dry. No rash noted. No erythema.  Psychiatric: He has a normal mood and affect. His behavior is normal.       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 will do a trial of rifaxamin 550mg  TID x 14 day. Imodium prn to see if it helps his symptoms.  rtc in 4 wk, to decide if need to proceed with fmt

## 2014-10-25 ENCOUNTER — Telehealth: Payer: Self-pay | Admitting: *Deleted

## 2014-10-25 NOTE — Telephone Encounter (Signed)
Can print out for patient

## 2014-10-25 NOTE — Telephone Encounter (Signed)
Patient's wife calling asking for last office note to be printed and picked up 11/11 while they are in town for another doctor's visit. She would also like a doctor's note to keep patient out of work until follow up 12/07/14 (employer has not yet asked, but she would like this just in case). Printed the office note.  Please advise on the note to keep patient out of work. Andree CossHowell, Kynisha Memon M, RN

## 2014-10-26 ENCOUNTER — Encounter: Payer: Self-pay | Admitting: *Deleted

## 2014-10-26 NOTE — Telephone Encounter (Signed)
Error

## 2014-10-27 ENCOUNTER — Encounter: Payer: Self-pay | Admitting: *Deleted

## 2014-10-27 NOTE — Telephone Encounter (Signed)
Letter printed, waiting for signature. Unable to contact patient to see if he wants it mailed or will pick it up.  Voicemail is full.

## 2014-11-15 ENCOUNTER — Telehealth: Payer: Self-pay | Admitting: *Deleted

## 2014-11-15 NOTE — Telephone Encounter (Signed)
"  Yellow, mucousy - like soft serve ice cream, still smells like c diff" bm x 3 a day, constant burning and pain in LLQ abdomen has returned (dull, aching). Afraid to use the immodium PRN. Pt is feeling weak, has joint tenderness when he walks (knees, ankles), general body aches -- also symptoms that have returned. Please advise what the next step should be.  Pt scheduled with you 12/22. Andree CossHowell, Dam Ashraf M, RN

## 2014-11-16 NOTE — Telephone Encounter (Signed)
Can you have him drop off stool to see if he has recurrent cdiff. He once thought he had it again but it was just IBS

## 2014-11-16 NOTE — Telephone Encounter (Signed)
Left message with Dr. Feliz BeamSnider's advice.

## 2014-12-07 ENCOUNTER — Ambulatory Visit: Payer: Self-pay | Admitting: Internal Medicine

## 2014-12-07 IMAGING — CT CT ABD-PELV W/ CM
2 of 5 series · 16 of 46 positions shown, 18 images · IV contrast (CONTRAST)
Comparison: DG ABD 1 VIEW dated 03/14/2014; CT ABD/PELV WO CM dated
03/06/2014

CLINICAL DATA: Diarrhea, nausea, febrile for 3 days.

EXAM:
CT ABDOMEN AND PELVIS WITH CONTRAST
TECHNIQUE: Multidetector CT imaging of the abdomen and pelvis was performed
using the standard protocol following bolus administration of
intravenous contrast.
CONTRAST:  100mL OMNIPAQUE IOHEXOL 300 MG/ML  SOLN

[Series 2: routine · axial · 0.97mm/px · z∈[+105,+590]mm · 13 of 111 slices shown, 15 images]
[im 7/111  soft-tissue]
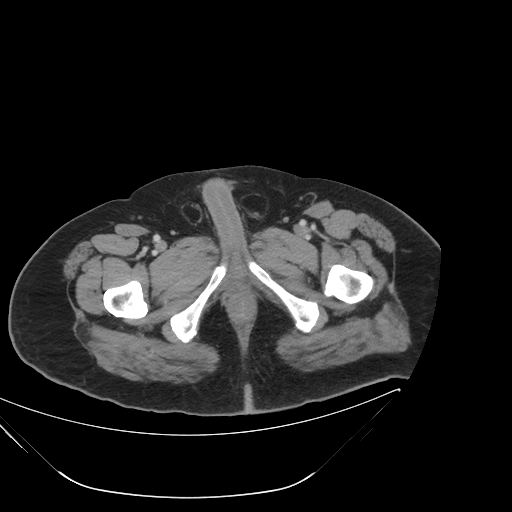
[im 7/111  bone]
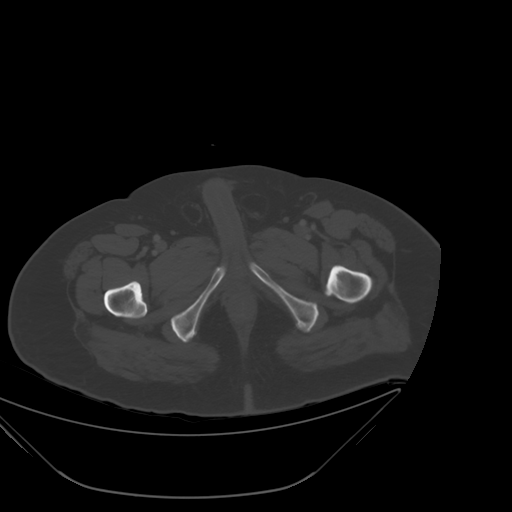
[im 13/111  soft-tissue]
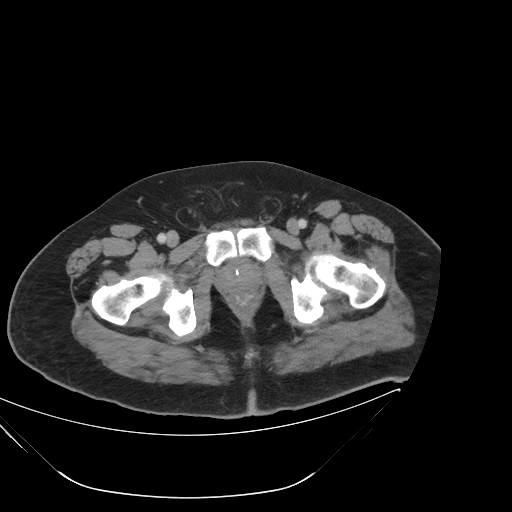
[im 26/111  soft-tissue]
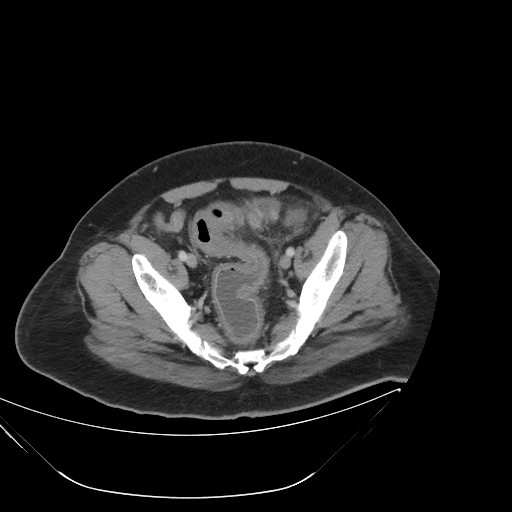
[im 33/111  soft-tissue]
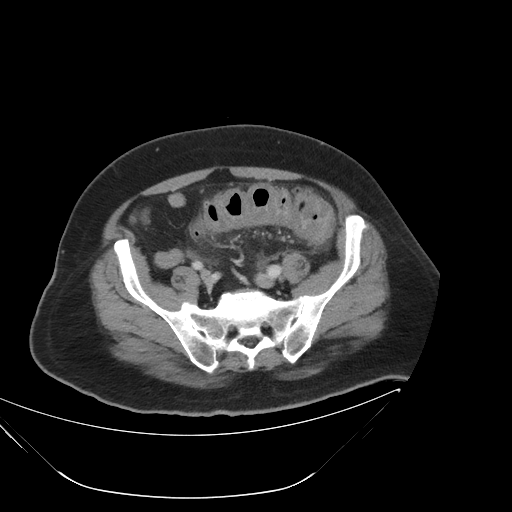
[im 39/111  soft-tissue]
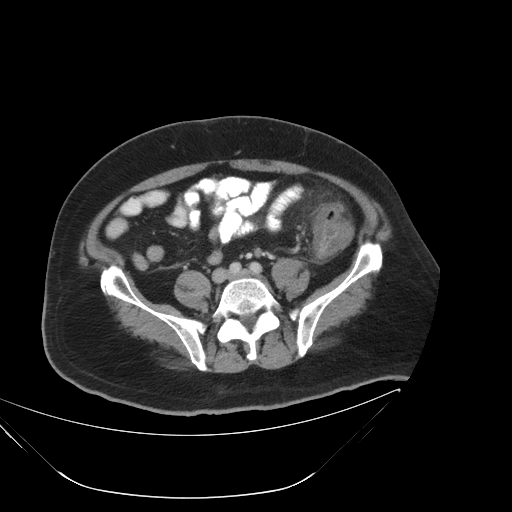
[im 46/111  soft-tissue]
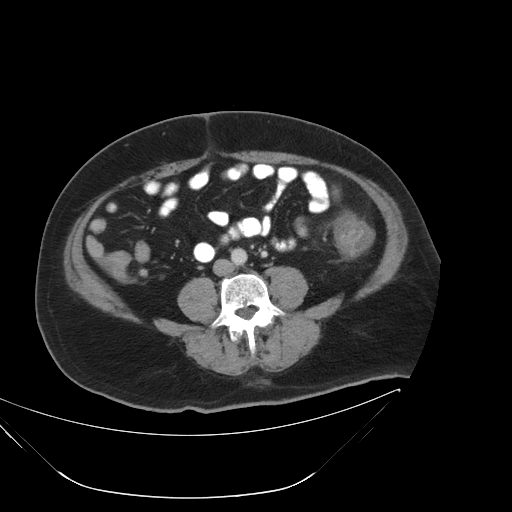
[im 59/111  soft-tissue]
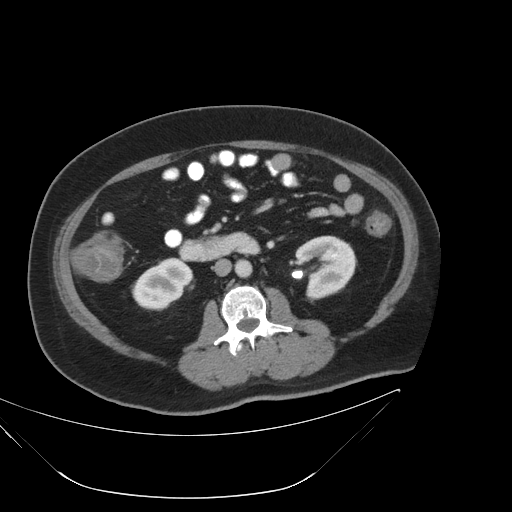
[im 65/111  soft-tissue]
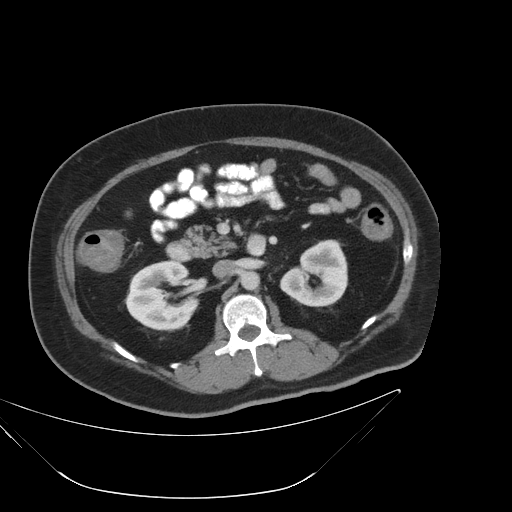
[im 72/111  soft-tissue]
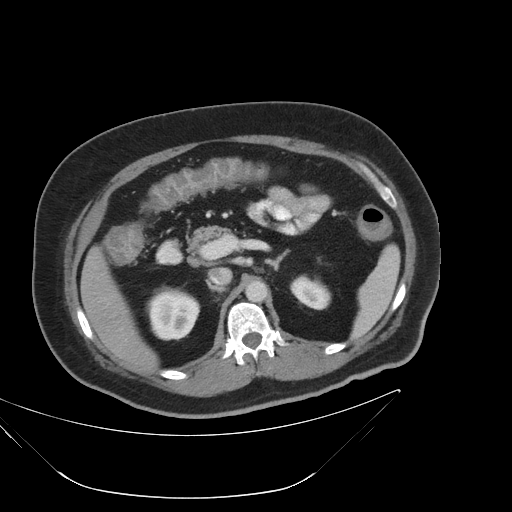
[im 72/111  bone]
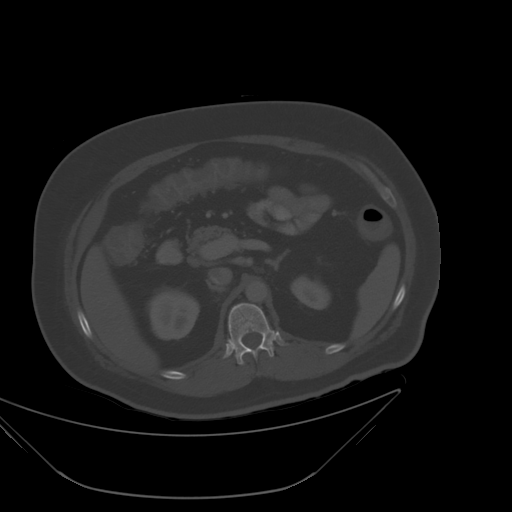
[im 78/111  soft-tissue]
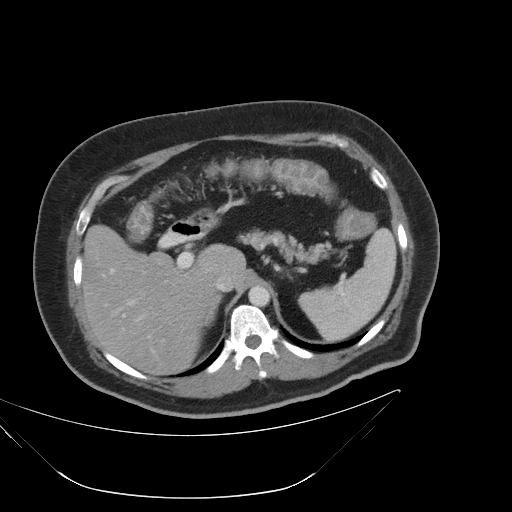
[im 85/111  soft-tissue]
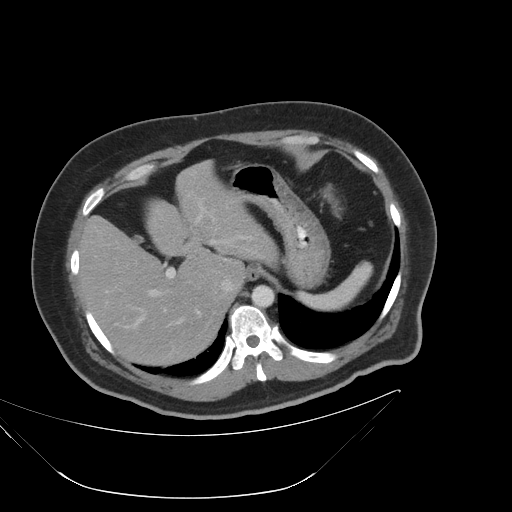
[im 98/111  soft-tissue]
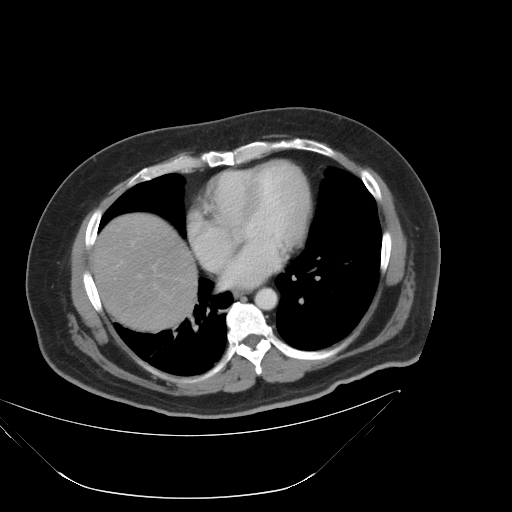
[im 104/111  soft-tissue]
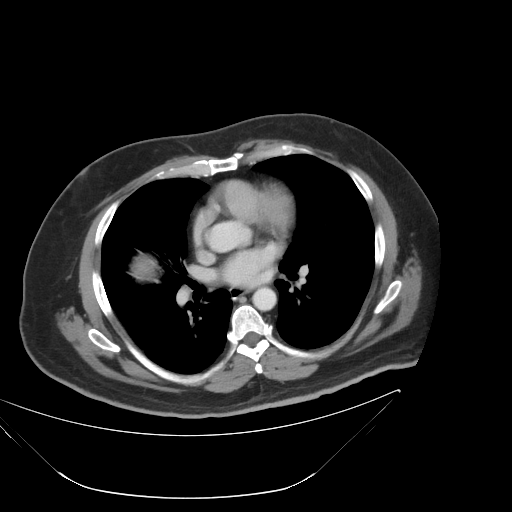

[mpr, coronals, coronal · coronal · 1.07mm/px · 3 of 111 slices shown]
[im 37/111  soft-tissue]
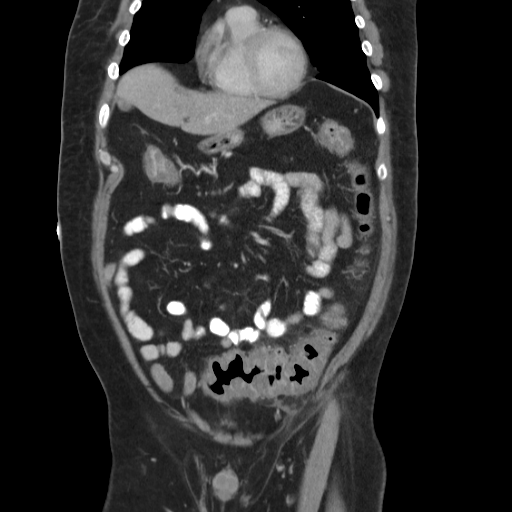
[im 49/111  soft-tissue]
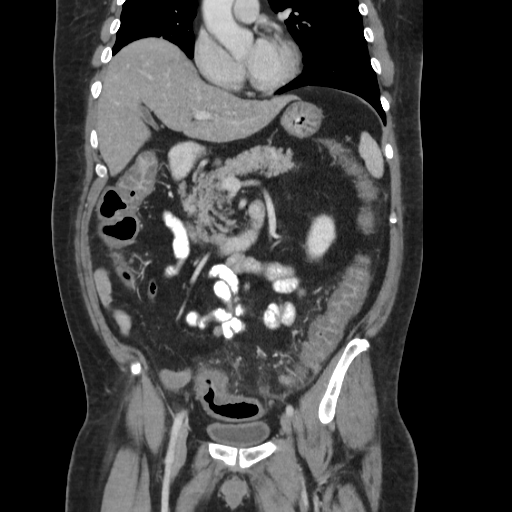
[im 62/111  soft-tissue]
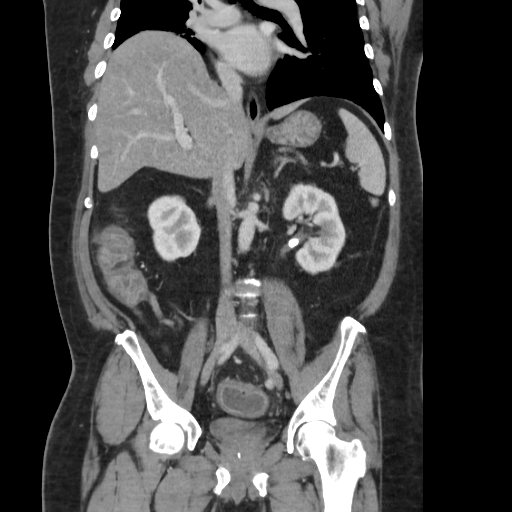

[16 of 46 positions shown; findings below may reference images not displayed]

FINDINGS: Included view of the lung bases demonstrate right lower lobe
atelectasis, slightly increased. . Visualized heart and pericardium
are unremarkable.

Diffuse colonic wall thickening and edema from the cecum to the
rectum with inflammatory pericolonic changes about the descending
colon/sigmoid colon. No pneumatosis. Scattered sigmoid diverticula.
The stomach, small bowel are normal in course and caliber without
inflammatory changes. Normal appendix. Trace free fluid in the
pelvis without abscess or pneumoperitoneum.

Mildly diffusely hypodense liver most consistent with fatty
infiltration, the liver is otherwise unremarkable. The spleen,
pancreas, gallbladder and adrenal glands are unremarkable and
unchanged.

10 mm of left renal pelvis nonobstructing calculus again seen, with
multiple tiny left lower pole renal calculi in total measuring
approximately 17 mm. No right nephrolithiasis. Two small to
characterize hypodensities in left kidney. No hydronephrosis. No
renal masses. Delayed imaging demonstrates prompt symmetric
excretion of contrast into the proximal urinary collecting system.
Urinary bladder is decompressed and unremarkable.

Great vessels are normal in course and caliber. No lymphadenopathy
by CT size criteria. Internal reproductive organs are unremarkable.
Small fat containing inguinal hernias. Moderate to severe
degenerative change of the right sacroiliac joint.
IMPRESSION: Diffuse colitis from the cecum to the rectum with inflammatory
changes about the sigmoid ; sigmoid diverticula, it is unclear
whether this reflects superimposed diverticulitis. No bowel
perforation or bowel obstruction. Trace free fluid in the pelvis is
likely reactive without abscess.

Multiple nonobstructing left nephrolithiasis.

  By: Miharu Guardamino Morales

## 2014-12-21 ENCOUNTER — Encounter: Payer: Self-pay | Admitting: Internal Medicine

## 2014-12-21 ENCOUNTER — Ambulatory Visit (INDEPENDENT_AMBULATORY_CARE_PROVIDER_SITE_OTHER): Payer: BLUE CROSS/BLUE SHIELD | Admitting: Internal Medicine

## 2014-12-21 VITALS — BP 170/103 | HR 88 | Temp 97.9°F | Wt 222.0 lb

## 2014-12-21 DIAGNOSIS — I10 Essential (primary) hypertension: Secondary | ICD-10-CM

## 2014-12-21 DIAGNOSIS — K589 Irritable bowel syndrome without diarrhea: Secondary | ICD-10-CM

## 2014-12-21 DIAGNOSIS — M19079 Primary osteoarthritis, unspecified ankle and foot: Secondary | ICD-10-CM

## 2014-12-21 DIAGNOSIS — F4321 Adjustment disorder with depressed mood: Secondary | ICD-10-CM | POA: Diagnosis not present

## 2014-12-21 DIAGNOSIS — A047 Enterocolitis due to Clostridium difficile: Secondary | ICD-10-CM | POA: Diagnosis not present

## 2014-12-21 DIAGNOSIS — A0471 Enterocolitis due to Clostridium difficile, recurrent: Secondary | ICD-10-CM

## 2014-12-21 MED ORDER — DICYCLOMINE HCL 20 MG PO TABS: 20.0000 mg | ORAL_TABLET | Freq: Three times a day (TID) | ORAL | Status: DC

## 2014-12-21 NOTE — Progress Notes (Signed)
Subjective:    Patient ID: [name] [name], male    DOB: 09-03-1956, 59 y.o.   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 Since we last saw him, he had brief bouth of feeling poorly with significant diarrhea in December but did not pursue retesting or re-treatment. It resolved somewhat and He now goes 3 x per day associated with abd pain in llq, and subscribes to having significant cramping when he had diarrheal episode. He reports decreased mood, being withdrawn. He was started on anti-depressant but feels that it has not improved his mood.  He states that he still feels that he has pains asosciated with arthritis of ankles bilaterally.  [name] [name] is here at this visit with his wife, both tearful for what they have gone through. [name] [name] has not returned to work, feels that he is not physical or mentally fit to be back at work.  He also reports that since he has been out of work, that he will shortly be out of health insurance, which is also a stressor for him.  Allergies  Allergen Reactions  . No Known Allergies    Current Outpatient Prescriptions on File Prior to Visit  Medication Sig Dispense Refill  . DULoxetine (CYMBALTA) 60 MG capsule Take 60 mg by mouth daily.    Marland Kitchen. olmesartan-hydrochlorothiazide (BENICAR HCT) 40-25 MG per tablet Take 1 tablet by mouth daily.    Marland Kitchen. saccharomyces boulardii (FLORASTOR) 250 MG capsule Take 1 capsule (250 mg total) by mouth 2 (two) times daily. 30 capsule 2  . ALPRAZolam (XANAX) 0.25 MG tablet Take 0.25 mg by mouth at bedtime as needed for sleep.    . rifaximin (XIFAXAN) 550 MG TABS tablet Take 1 tablet (550 mg total) by mouth 3 (three) times daily. (Patient not taking: Reported on 12/21/2014) 42 tablet 0   No current facility-administered medications on file prior to visit.   Active Ambulatory Problems    Diagnosis Date Noted  . Kidney stone  03/14/2014  . Hypertension 03/15/2014  . 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 Gout attack 04/03/2014  . Colon cancer screening 09/22/2014  . Swelling of both ankles - question reaction to vancomycin versus inflammatory arthritis reaction to C. difficile 09/22/2014   Resolved Ambulatory Problems    Diagnosis Date Noted  . Diarrhea 03/14/2014  . Hypokalemia 03/14/2014  . Dehydration 03/14/2014  . Leukocytosis 04/03/2014   Past Medical History  Diagnosis Date  . Depression   . Kidney stones   . C. difficile colitis 04-27-14  . Abnormal gait 04-27-14    Review of Systems Review of Systems  Constitutional: Negative for fever, chills, diaphoresis, activity change, appetite change, fatigue and unexpected weight change.  HENT: Negative for congestion, sore throat, rhinorrhea, sneezing, trouble swallowing and sinus pressure.  Eyes: Negative for photophobia and visual disturbance.  Respiratory: Negative for cough, chest tightness, shortness of breath, wheezing and stridor.  Cardiovascular: Negative for chest pain, palpitations and leg swelling.  Gastrointestinal: positive for abdominal pain, diarrhea,but negative for constipation, blood in stool, abdominal distention and anal bleeding.  Genitourinary: Negative for dysuria, hematuria, flank pain and difficulty urinating.  Musculoskeletal: Negative for myalgias, back pain, joint swelling, arthralgias and gait problem.  Skin: Negative for color change, pallor, rash and wound.  Neurological: Negative for dizziness, tremors, weakness and light-headedness.  Hematological: Negative for adenopathy. Does not bruise/bleed easily.  Psychiatric/Behavioral: positive for confusion,  sleep disturbance, dysphoric mood, decreased concentration and forgetfulness       Objective:   Physical Exam BP 170/103 mmHg  Pulse 88  Temp(Src) 97.9 F (36.6 C) (Oral)  Wt 222 lb (100.699 kg) Physical Exam  Constitutional: He is oriented to  person, place, and time. He appears well-developed and well-nourished. Somewhat flat affect (new) fatigue appearing HENT:  Mouth/Throat: Oropharynx is clear and moist. No oropharyngeal exudate.  Cardiovascular: Normal rate, regular rhythm and normal heart sounds. Exam reveals no gallop and no friction rub.  No murmur heard.  Abdominal: Soft. Bowel sounds are normal. He exhibits no distension. There is no tenderness.  Skin: Skin is warm and dry. No rash noted. No erythema.  Psychiatric: flat affect, tearful during part of the exam       Assessment & Plan:  Recurrent cdifficile c/b post infectious diarrhea = will repeat cdiff pcr to ensure not having recurrent flare. If it is positive, will retreat with oral vancomycin and pursue fecal transplant. If it is negative, we will treat as post infectious irritable bowel syndrome and do a trial of bentyl.   Situational Depression =seeing pcp today, consider changing regimen. Also recommend that they do counseling. It appears from today's visit, that his repeated infections of c.difficile and current bouts of diarrhea have taken a toll not only on physical health but also overall outlook on life. He exhibitis several symptoms for depression (excess sleeping, anhedonia, choosing to be alone, emotional lability). No harm to self or others, but still would need improvement in management of depression prior to going back to work.  Hypertension = defer to pcp to start addn medications  Arthritis of ankles = could possibly be due to reactive arthritis from bouts of cdi. Less likely as side effect of oral vancomycin, especially since he is no longer on it. He is being referred to rheumatology per his wife's report  Patient is asking for letters for insurance.   Spent 45 min with patient, with greater than 50% in coordination of care surrounding cdifficile  Arthritis =may have reactive arthritis from multiple cdifficile infection, recommend to follow up with  rheum

## 2014-12-22 ENCOUNTER — Telehealth: Payer: Self-pay | Admitting: *Deleted

## 2014-12-22 NOTE — Telephone Encounter (Signed)
Requesting "out-of-work" note until return OV, 02/17/15.  Also requesting a copy of last office visit notes.

## 2014-12-24 ENCOUNTER — Telehealth: Payer: Self-pay | Admitting: Licensed Clinical Social Worker

## 2014-12-24 NOTE — Telephone Encounter (Signed)
Patient is dropping off stool today and wanted to know if he could get a letter to stay out until the next office visit in March. Please advise. Patient states he could come back Monday if it is not ready today

## 2014-12-27 ENCOUNTER — Other Ambulatory Visit: Payer: PRIVATE HEALTH INSURANCE

## 2014-12-27 ENCOUNTER — Other Ambulatory Visit: Payer: Self-pay | Admitting: Licensed Clinical Social Worker

## 2014-12-27 DIAGNOSIS — R197 Diarrhea, unspecified: Secondary | ICD-10-CM

## 2014-12-28 ENCOUNTER — Telehealth: Payer: Self-pay | Admitting: Internal Medicine

## 2014-12-28 ENCOUNTER — Encounter: Payer: Self-pay | Admitting: Internal Medicine

## 2014-12-28 LAB — CLOSTRIDIUM DIFFICILE BY PCR: Toxigenic C. Difficile by PCR: NOT DETECTED

## 2014-12-28 NOTE — Telephone Encounter (Signed)
This is the copy of the email from patient and his wife asking for us to write a letter regarding his care from cdi:   Good afternoon, thank you so much for all your encouragement to [name] he appreciates you. 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 Although it is such, [employer] has been covering all his expenses, but in case he loses his [employer] coverage in the future he needs a letter from his treating physician so the work [name] insurance through the [name] can begin picking up any expenses if necessary from thereafter for any future problems associated with the c-diff which includes any re-infection, any complications, FMT, treatment for IBS, depression, etc., anything that might crop up. The [place] work Psychologist, clinicalcomp commission has a benefit that if you acquire a work place injury or illness they will cover it for your lifetime for any future problems. Without the letter the [name] will close his case and he will not be entitled to future medical benefits. There will be a hearing on January 12 to determine whether he needs "Lifetime Medical Benefits" to protect him. Let me be clear just in case, this is not a letter of disability but rather to insure he gets awarded lifetime medical benefits to cover expenses for possible future treatment purposes. It can be a formal letter if you prefer with all the identifying information from [place] or it can be an Building control surveyorinformal letter, which ever you prefer. I have included a sample of what he needs the letter to say but please feel free to use your own language, we just need to make sure he has medical coverage for any future problems past January 12. If you would like it to be a formal letter I have included the information you would need to add to it, but as I stated it can be an informal letter. If you have any questions or concerns  please let me know, thank you so much, we appreciate your help. Regards, [name] [name] Jurisdiction Claim Number: [id number] Date of Injury: March 11, 2014

## 2014-12-31 ENCOUNTER — Telehealth: Payer: Self-pay

## 2014-12-31 NOTE — Telephone Encounter (Signed)
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  ----- Message -----    From: Iva Booparl E Gessner, MD    Sent: 12/21/2014   2:46 PM      To: Judyann Munsonynthia Snider, MD  I guess if test + then we should just do FMT but if negative I will see him and fine tune the IBS   If C diff neg may go with colestipol ----- Message -----    From: Judyann Munsonynthia Snider, MD    Sent: 12/21/2014  11:53 AM      To: Iva Booparl E Gessner, MD  Hi carl, i hope you recovered from your week of call. I saw mr. Nabers. Objectively having 3 loose stools per day with some cramping. He did have a bout of diarrhea x 1 wk but felt too poorly, too depressed to get repeat testing. i rechecked his cdiff (if negative, then can get back to work if he is up to it), and placed him on bentyl. Can you see him too to see if anything can be fine tuned?

## 2014-12-31 NOTE — Telephone Encounter (Signed)
Left message for patient to call back  

## 2015-01-03 NOTE — Telephone Encounter (Signed)
Left message for patient to call back  

## 2015-01-04 NOTE — Telephone Encounter (Signed)
Left message for patient to call back  

## 2015-01-05 NOTE — Telephone Encounter (Signed)
Agree 

## 2015-01-05 NOTE — Telephone Encounter (Signed)
Left message for patient to call back Unable to reach the patient.  Will await a return call

## 2015-01-25 ENCOUNTER — Ambulatory Visit: Payer: Self-pay | Admitting: Internal Medicine

## 2015-02-10 ENCOUNTER — Other Ambulatory Visit: Payer: Self-pay | Admitting: Internal Medicine

## 2015-02-17 ENCOUNTER — Telehealth: Payer: Self-pay | Admitting: *Deleted

## 2015-02-17 ENCOUNTER — Encounter: Payer: Self-pay | Admitting: Internal Medicine

## 2015-02-17 ENCOUNTER — Ambulatory Visit (INDEPENDENT_AMBULATORY_CARE_PROVIDER_SITE_OTHER): Payer: BLUE CROSS/BLUE SHIELD | Admitting: Internal Medicine

## 2015-02-17 VITALS — BP 158/94 | HR 96 | Temp 97.8°F | Wt 224.0 lb

## 2015-02-17 DIAGNOSIS — M064 Inflammatory polyarthropathy: Secondary | ICD-10-CM | POA: Diagnosis not present

## 2015-02-17 DIAGNOSIS — K589 Irritable bowel syndrome without diarrhea: Secondary | ICD-10-CM | POA: Diagnosis not present

## 2015-02-17 DIAGNOSIS — M130 Polyarthritis, unspecified: Secondary | ICD-10-CM

## 2015-02-17 DIAGNOSIS — F329 Major depressive disorder, single episode, unspecified: Secondary | ICD-10-CM

## 2015-02-17 DIAGNOSIS — F32A Depression, unspecified: Secondary | ICD-10-CM

## 2015-02-17 LAB — C-REACTIVE PROTEIN: CRP: 0.7 mg/dL — ABNORMAL HIGH (ref <0.60)

## 2015-02-17 MED ORDER — ESCITALOPRAM OXALATE 20 MG PO TABS: 20.0000 mg | ORAL_TABLET | Freq: Every day | ORAL | Status: AC

## 2015-02-17 MED ORDER — DULOXETINE HCL 60 MG PO CPEP: 60.0000 mg | ORAL_CAPSULE | Freq: Every day | ORAL | Status: AC

## 2015-02-17 MED ORDER — DICYCLOMINE HCL 20 MG PO TABS: 20.0000 mg | ORAL_TABLET | Freq: Three times a day (TID) | ORAL | Status: DC

## 2015-02-17 NOTE — Telephone Encounter (Signed)
Per Dr Drue SecondSnider referral called Dr Corliss Skainseveshwar office and advised to fax office notes, demographics, and insurance info for the doctor to review and they will call the patient directly to schedule. Information faxed to [phone] and patient notified of the process and advised to call in 2 weeks if he has not heard anything.  Due to the patient insurance gave him the #[phone] to call and get a list of Psychiatrist in the area on his plan and pick one to see. Advised him to call the office if they need anything from us once he makes an appt.

## 2015-02-17 NOTE — Progress Notes (Signed)
Subjective:    Patient ID: [name] [name], male    DOB: [birth year], 59 y.o.   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 He was a prolonged vanco taper and had resolution of his infection. He did have a few bouts of what he thought were recurrences of cdi due to watery frequent BM, with distinctive odor but when he was retested, it was negative. 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 During this period, he remained out of work and pursued short term disability.    His bowel habits have been 3 BM per day. Still has urgency but abd spasm improved with taking bentyl. He has not tried taking immodium. He was taking rifaximin up until 1 month ago (slightly off schedule than I had known). He needs refill on bentyl   His energy remains low, as his depression is still not well controlled. He has thoughts of hurting himself but does not act upon them. He talks of injuring himself but not killing, " i am too much of a coward". He confides in his wife when he has these dark thoughts. He has gone to his pcp asking for psych referral but has not come to fruition. His pcp has changed some of his meds but not helped significantly enough  In part with his recurrent cdiff infection, he would have multiple joints become sore, inflamed and thought he may have reactive athritis to ankles, and knees, which have improved. He now has pain in small joints of hands bilaterally and his hips. He finds it difficult to climb stairs.  Allergies  Allergen Reactions  . No Known Allergies    Current Outpatient Prescriptions on File Prior to Visit  Medication Sig Dispense Refill  . ALPRAZolam (XANAX) 0.25 MG tablet Take 0.25 mg by mouth at bedtime as needed for sleep.    Marland Kitchen dicyclomine (BENTYL) 20 MG tablet Take 1 tablet (20 mg total) by mouth 4 (four) times daily -  before meals and at bedtime. 80  tablet 1  . DULoxetine (CYMBALTA) 60 MG capsule Take 60 mg by mouth daily.    Marland Kitchen olmesartan-hydrochlorothiazide (BENICAR HCT) 40-25 MG per tablet Take 1 tablet by mouth daily.    Marland Kitchen saccharomyces boulardii (FLORASTOR) 250 MG capsule Take 1 capsule (250 mg total) by mouth 2 (two) times daily. 30 capsule 2  . rifaximin (XIFAXAN) 550 MG TABS tablet Take 1 tablet (550 mg total) by mouth 3 (three) times daily. (Patient not taking: Reported on 12/21/2014) 42 tablet 0   No current facility-administered medications on file prior to visit.   Active Ambulatory Problems    Diagnosis Date Noted  . Kidney stone 03/14/2014  . Hypertension 03/15/2014  . 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 Gout attack 04/03/2014  . Colon cancer screening 09/22/2014  . Swelling of both ankles - question reaction to vancomycin versus inflammatory arthritis reaction to C. difficile 09/22/2014   Resolved Ambulatory Problems    Diagnosis Date Noted  . Diarrhea 03/14/2014  . Hypokalemia 03/14/2014  . Dehydration 03/14/2014  . Leukocytosis 04/03/2014   Past Medical History  Diagnosis Date  . Depression   . Kidney stones   . C. difficile colitis 04-27-14  . Abnormal gait 04-27-14      Review of Systems  Constitutional: Negative for fever, chills, diaphoresis, activity change, appetite change, fatigue and unexpected  weight change.  HENT: Negative for congestion, sore throat, rhinorrhea, sneezing, trouble swallowing and sinus pressure.  Eyes: Negative for photophobia and visual disturbance.  Respiratory: Negative for cough, chest tightness, shortness of breath, wheezing and stridor.  Cardiovascular: Negative for chest pain, palpitations and leg swelling.  Gastrointestinal: Negative for nausea, vomiting, abdominal pain, diarrhea, constipation, blood in stool, abdominal distention and anal bleeding.  Genitourinary: Negative for dysuria, hematuria, flank pain and difficulty urinating.    Musculoskeletal: Negative for myalgias, back pain, joint swelling, arthralgias and gait problem.  Skin: Negative for color change, pallor, rash and wound.  Neurological: Negative for dizziness, tremors, weakness and light-headedness.  Hematological: Negative for adenopathy. Does not bruise/bleed easily.  Psychiatric/Behavioral: Negative for behavioral problems, confusion, sleep disturbance, dysphoric mood, decreased concentration and agitation.       Objective:   Physical Exam BP 158/94 mmHg  Pulse 96  Temp(Src) 97.8 F (36.6 C) (Oral)  Wt 224 lb (101.606 kg) Physical Exam  Constitutional: He is oriented to person, place, and time. He appears well-developed and well-nourished. No distress.  HENT:  Mouth/Throat: Oropharynx is clear and moist. No oropharyngeal exudate.  Cardiovascular: Normal rate, regular rhythm and normal heart sounds. Exam reveals no gallop and no friction rub.  No murmur heard.  Pulmonary/Chest: Effort normal and breath sounds normal. No respiratory distress. He has no wheezes.  Abdominal: Soft. Bowel sounds are normal. He exhibits no distension. There is no tenderness.  Lymphadenopathy:  He has no cervical adenopathy.  Neurological: He is alert and oriented to person, place, and time.  Skin: Skin is warm and dry. No rash noted. No erythema.  Psychiatric: He has a normal mood and affect. His behavior is normal.          Assessment & Plan:  Post infectious ibs = will refill bentyl and ask him to use immodium  Depression = initially does have underlying depression that worsened during this period of having recurrent cdifficile and unable to work. Now concern that he is still having debilitating depression. Will refer to psych and psychology. Gave refills on antidepressants until he can see psych  Recurrent cdi = not recently found since last checked.  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 Other rheum issue. We will check sed  rate and crp to see if he has pmr due to hip girdle involvement. Will refer to rheumatology for further work up.

## 2015-02-18 LAB — SEDIMENTATION RATE: SED RATE: 4 mm/h (ref 0–20)

## 2015-04-19 ENCOUNTER — Ambulatory Visit: Payer: Self-pay | Admitting: Internal Medicine

## 2015-04-25 ENCOUNTER — Ambulatory Visit: Payer: Self-pay | Admitting: Internal Medicine

## 2015-04-25 ENCOUNTER — Ambulatory Visit (INDEPENDENT_AMBULATORY_CARE_PROVIDER_SITE_OTHER): Payer: BLUE CROSS/BLUE SHIELD | Admitting: Internal Medicine

## 2015-04-25 ENCOUNTER — Encounter: Payer: Self-pay | Admitting: Internal Medicine

## 2015-04-25 VITALS — BP 170/100 | HR 86 | Temp 97.2°F | Wt 220.0 lb

## 2015-04-25 DIAGNOSIS — M2550 Pain in unspecified joint: Secondary | ICD-10-CM | POA: Diagnosis not present

## 2015-04-25 DIAGNOSIS — K589 Irritable bowel syndrome without diarrhea: Secondary | ICD-10-CM | POA: Diagnosis not present

## 2015-04-25 DIAGNOSIS — F332 Major depressive disorder, recurrent severe without psychotic features: Secondary | ICD-10-CM | POA: Diagnosis not present

## 2015-04-25 LAB — CBC WITH DIFFERENTIAL/PLATELET
BASOS PCT: 1 % (ref 0–1)
Basophils Absolute: 0.1 10*3/uL (ref 0.0–0.1)
Eosinophils Absolute: 0.1 10*3/uL (ref 0.0–0.7)
Eosinophils Relative: 2 % (ref 0–5)
HCT: 45.5 % (ref 39.0–52.0)
Hemoglobin: 15.4 g/dL (ref 13.0–17.0)
LYMPHS PCT: 26 % (ref 12–46)
Lymphs Abs: 1.9 10*3/uL (ref 0.7–4.0)
MCH: 29 pg (ref 26.0–34.0)
MCHC: 33.8 g/dL (ref 30.0–36.0)
MCV: 85.7 fL (ref 78.0–100.0)
MPV: 9.6 fL (ref 8.6–12.4)
Monocytes Absolute: 0.5 10*3/uL (ref 0.1–1.0)
Monocytes Relative: 7 % (ref 3–12)
NEUTROS ABS: 4.7 10*3/uL (ref 1.7–7.7)
Neutrophils Relative %: 64 % (ref 43–77)
PLATELETS: 288 10*3/uL (ref 150–400)
RBC: 5.31 MIL/uL (ref 4.22–5.81)
RDW: 15.1 % (ref 11.5–15.5)
WBC: 7.4 10*3/uL (ref 4.0–10.5)

## 2015-04-25 LAB — BASIC METABOLIC PANEL
BUN: 10 mg/dL (ref 6–23)
CHLORIDE: 107 meq/L (ref 96–112)
CO2: 25 meq/L (ref 19–32)
CREATININE: 1.05 mg/dL (ref 0.50–1.35)
Calcium: 9.3 mg/dL (ref 8.4–10.5)
Glucose, Bld: 105 mg/dL — ABNORMAL HIGH (ref 70–99)
Potassium: 3.7 mEq/L (ref 3.5–5.3)
Sodium: 143 mEq/L (ref 135–145)

## 2015-04-25 LAB — C-REACTIVE PROTEIN: CRP: <0.5 mg/dL (ref <0.60)

## 2015-04-25 NOTE — Progress Notes (Signed)
Patient ID: [name] [name], male   DOB: 1956-03-27, 59 y.o.   MRN: [id number]       Patient ID: [name] [name], male   DOB: 1956-03-27, 59 y.o.   MRN: [id number]  HPI 59yo M with past hx of CDI now complicated by post infectious IBS. He has been on bentyl with some mild improvement, still has 3-4 loose stools with cramping and urgency. He states that his depression is still bad, and that he was able to see psychiatrist today.   He also complains of right foot and right ankle pain but no trauma, no swelling, no erythema  Outpatient Encounter Prescriptions as of 04/25/2015  Medication Sig  . ALPRAZolam (XANAX) 0.25 MG tablet Take 0.25 mg by mouth at bedtime as needed for sleep.  Marland Kitchen. dicyclomine (BENTYL) 20 MG tablet Take 1 tablet (20 mg total) by mouth 4 (four) times daily -  before meals and at bedtime.  . DULoxetine (CYMBALTA) 60 MG capsule Take 1 capsule (60 mg total) by mouth daily.  Marland Kitchen. escitalopram (LEXAPRO) 20 MG tablet Take 1 tablet (20 mg total) by mouth daily.  Marland Kitchen. olmesartan-hydrochlorothiazide (BENICAR HCT) 40-25 MG per tablet Take 1 tablet by mouth daily.  Marland Kitchen. saccharomyces boulardii (FLORASTOR) 250 MG capsule Take 1 capsule (250 mg total) by mouth 2 (two) times daily.   No facility-administered encounter medications on file as of 04/25/2015.     Patient Active Problem List   Diagnosis Date Noted  . Colon cancer screening 09/22/2014  . Swelling of both ankles - question reaction to vancomycin versus inflammatory arthritis reaction to C. difficile 09/22/2014  . Gout attack 04/03/2014  . Hypertension 03/15/2014  . 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 Kidney stone 03/14/2014     Health Maintenance Due  Topic Date Due  . HIV Screening  04/15/1970  . TETANUS/TDAP  04/15/1974  . COLONOSCOPY  04/15/2005  . ZOSTAVAX  04/16/2015     Review of Systems + gi symptoms, diarrhea ,abd cramping. depression Physical Exam   BP 170/100 mmHg  Pulse 86  Temp(Src) 97.2 F  (36.2 C) (Oral)  Wt 220 lb (99.791 kg) Physical Exam  Constitutional: He is oriented to person, place, and time. He appears well-developed and well-nourished. No distress.  HENT:  Mouth/Throat: Oropharynx is clear and moist. No oropharyngeal exudate.  Cardiovascular: Normal rate, regular rhythm and normal heart sounds. Exam reveals no gallop and no friction rub.  No murmur heard.  Pulmonary/Chest: Effort normal and breath sounds normal. No respiratory distress. He has no wheezes.  Abdominal: Soft. Bowel sounds are normal. He exhibits no distension. There is no tenderness.  Lymphadenopathy:  He has no cervical adenopathy.  Neurological: He is alert and oriented to person, place, and time.  Skin: Skin is warm and dry. No rash noted. No erythema.  Psychiatric: He has a normal mood and affect. His behavior is normal.    CBC Lab Results  Component Value Date   WBC 5.5 10/09/2014   RBC 5.41 10/09/2014   HGB 15.8 10/09/2014   HCT 45.2 10/09/2014   PLT 188 10/09/2014   MCV 83.5 10/09/2014   MCH 29.2 10/09/2014   MCHC 35.0 10/09/2014   RDW 15.0 10/09/2014   LYMPHSABS 1.9 10/09/2014   MONOABS 0.5 10/09/2014   EOSABS 0.2 10/09/2014   BASOSABS 0.1 10/09/2014   BMET Lab Results  Component Value Date   NA 139 10/09/2014   K 3.4* 10/09/2014   CL 100 10/09/2014   CO2 22 10/09/2014  GLUCOSE 170* 10/09/2014   BUN 9 10/09/2014   CREATININE 0.80 10/09/2014   CALCIUM 9.4 10/09/2014   GFRNONAA >90 10/09/2014   GFRAA >90 10/09/2014     Assessment and Plan MDD = he was just seen by psychiatry who is starting him on medications as well as has follow up on weekly basis. If symptoms worsen, agree that acute psych admission may be warranted. He presently contracts for safety. No intention to act on suicidal thoughts  Ibs, abdominal cramping and diarrhea = continue on bentyl, increase to 4 x a day. Will also add levsin to see if helps his symptoms  Polyarthralgia = unclear if he may have  polyarthritis linked to fibromyalgia, will check sed rate and crp, cbc with diff and bmp. Previous auto immune work up is negative. Will refer to rheumatology to complete work up and assessment

## 2015-04-26 LAB — SEDIMENTATION RATE: Sed Rate: 1 mm/hr (ref 0–20)

## 2015-04-28 MED ORDER — HYOSCYAMINE SULFATE 0.125 MG SL SUBL: 0.1250 mg | SUBLINGUAL_TABLET | SUBLINGUAL | Status: AC | PRN

## 2015-07-26 ENCOUNTER — Ambulatory Visit: Payer: Self-pay | Admitting: Internal Medicine

## 2015-08-23 ENCOUNTER — Ambulatory Visit: Payer: Self-pay | Admitting: Internal Medicine

## 2015-11-07 ENCOUNTER — Other Ambulatory Visit: Payer: Self-pay | Admitting: Internal Medicine

## 2015-11-23 ENCOUNTER — Ambulatory Visit: Payer: Self-pay | Admitting: Internal Medicine

## 2015-12-01 ENCOUNTER — Ambulatory Visit (INDEPENDENT_AMBULATORY_CARE_PROVIDER_SITE_OTHER): Payer: BLUE CROSS/BLUE SHIELD | Admitting: Internal Medicine

## 2015-12-01 ENCOUNTER — Encounter: Payer: Self-pay | Admitting: Internal Medicine

## 2015-12-01 VITALS — BP 148/83 | HR 80 | Temp 98.6°F | Wt 223.0 lb

## 2015-12-01 DIAGNOSIS — R413 Other amnesia: Secondary | ICD-10-CM | POA: Diagnosis not present

## 2015-12-01 DIAGNOSIS — F332 Major depressive disorder, recurrent severe without psychotic features: Secondary | ICD-10-CM

## 2015-12-01 DIAGNOSIS — A498 Other bacterial infections of unspecified site: Secondary | ICD-10-CM

## 2015-12-01 DIAGNOSIS — F259 Schizoaffective disorder, unspecified: Secondary | ICD-10-CM

## 2015-12-01 DIAGNOSIS — B9689 Other specified bacterial agents as the cause of diseases classified elsewhere: Secondary | ICD-10-CM

## 2015-12-01 DIAGNOSIS — K589 Irritable bowel syndrome without diarrhea: Secondary | ICD-10-CM

## 2015-12-01 NOTE — Progress Notes (Signed)
   Subjective:    Patient ID: Philomena CourseJohn Teegarden, male    DOB: [birth year], 59 y.o.   MRN: [id number]  HPI 59 yo M with history of recurrent c.difficile that was complicated by post infectious IBS.  Last seen in may 2016. He is now being managed by his pcp in [place] and locally. He is Taking ?verbisy with immodium as needed. Also mental health improved with less psychosis and severe depression but still has significant anxiety, with following with psychiatry.also starting to see psychologist. Dx with schizoaffective disorder with depression. He states that he is still having difficulty with concentration and compression. He remembers an example of having difficulty with reading instructions for snowman display. Broke his nose forgetting about closing sliding glass door. He finds this quite debilitating and not able to go back to work.  Still has urgency, with 3-4 bm per day. Afraid to fly due to risk of defecation  Allergies  Allergen Reactions  . No Known Allergies    Current Outpatient Prescriptions on File Prior to Visit  Medication Sig Dispense Refill  . ALPRAZolam (XANAX) 0.25 MG tablet Take 0.25 mg by mouth at bedtime as needed for sleep.    Marland Kitchen. dicyclomine (BENTYL) 20 MG tablet TAKE 1 TABLET BY MOUTH 4 TIMES A DAY BEFORE MEALS AND A T BEDTIME 80 tablet 0  . DULoxetine (CYMBALTA) 60 MG capsule Take 1 capsule (60 mg total) by mouth daily. (Patient taking differently: Take 120 mg by mouth daily. ) 30 capsule 2  . escitalopram (LEXAPRO) 20 MG tablet Take 1 tablet (20 mg total) by mouth daily. 30 tablet 2  . hyoscyamine (LEVSIN/SL) 0.125 MG SL tablet Place 1 tablet (0.125 mg total) under the tongue every 4 (four) hours as needed. 30 tablet 0  . olmesartan-hydrochlorothiazide (BENICAR HCT) 40-25 MG per tablet Take 1 tablet by mouth daily.    Marland Kitchen. saccharomyces boulardii (FLORASTOR) 250 MG capsule Take 1 capsule (250 mg total) by mouth 2 (two) times daily. 30 capsule 2   No current facility-administered  medications on file prior to visit.   Active Ambulatory Problems    Diagnosis Date Noted  . Kidney stone 03/14/2014  . Hypertension 03/15/2014  . 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 Gout attack 04/03/2014  . Colon cancer screening 09/22/2014  . Swelling of both ankles - question reaction to vancomycin versus inflammatory arthritis reaction to C. difficile 09/22/2014   Resolved Ambulatory Problems    Diagnosis Date Noted  . Diarrhea 03/14/2014  . Hypokalemia 03/14/2014  . Dehydration 03/14/2014  . Leukocytosis 04/03/2014   Past Medical History  Diagnosis Date  . Depression   . Kidney stones   . C. difficile colitis 04-27-14  . Abnormal gait 04-27-14     Review of Systems + absent minded. Forgetful with short-term memory loss    Objective:   Physical Exam gen = a x o by 3 in nad       Assessment & Plan:  Schizoaffective disorder plus severe anxiety = [facility]  Forgetfullness= difficult to tell if it is related to underlying mental health that needs to be optimized vs. New onset memory disorder  Hx of recurrent c.difficile now complicated by sequelae of ibs = recommend to doing twice a day immodium to minimize  Jamse BelfastOscar oropeza - [place]cp in [place], [place], [place] ; [phone]   Spent 30 min with patient 100% in counseling  Wife providers his meds

## 2015-12-27 ENCOUNTER — Telehealth: Payer: Self-pay | Admitting: *Deleted

## 2015-12-27 NOTE — Telephone Encounter (Signed)
Patient's attorney called requesting that Dr. Drue SecondSnider email him because he has further questions. He first asked to be scheduled for a phone conference; however I explained we do not have a schedule for that. His email is [email or web address]. Wendall MolaJacqueline Cockerham

## 2016-03-28 ENCOUNTER — Telehealth: Payer: Self-pay | Admitting: *Deleted

## 2016-03-28 NOTE — Telephone Encounter (Signed)
Inocencio HomesGayle at attorney Viviann SpareSteven Bass's office called to try and set up a phone call with Dr Drue SecondSnider they have a couple of questions about the patient CDiff and are about to go before the workers comp board. Advised her the doctor is out of the office until next week but could send her a message to give the office a call when she can.  Inocencio HomesGayle [phone] please call office when you can.

## 2016-04-02 NOTE — Telephone Encounter (Signed)
Will do it today, monday

## 2016-04-19 ENCOUNTER — Telehealth: Payer: Self-pay | Admitting: *Deleted

## 2016-04-19 NOTE — Telephone Encounter (Signed)
Inocencio HomesGayle from attorney Hulen SkainsStephen Bass's office calling to check on the letter/form previously requested.  Please advise. Andree CossHowell, Megann Easterwood M, RN

## 2016-04-23 NOTE — Telephone Encounter (Signed)
Called attorney office and had forms resent and given to Dr Drue SecondSnider will check in tomorrow to see if complete.
